# Patient Record
Sex: Female | Born: 1980
Health system: Southern US, Community
[De-identification: ages and names within clinical notes are randomized; demographics above are authoritative.]

## PROBLEM LIST (undated history)

## (undated) DIAGNOSIS — M5136 Other intervertebral disc degeneration, lumbar region: Secondary | ICD-10-CM

## (undated) DIAGNOSIS — G43909 Migraine, unspecified, not intractable, without status migrainosus: Secondary | ICD-10-CM

## (undated) DIAGNOSIS — R002 Palpitations: Secondary | ICD-10-CM

## (undated) DIAGNOSIS — A64 Unspecified sexually transmitted disease: Secondary | ICD-10-CM

## (undated) DIAGNOSIS — M5126 Other intervertebral disc displacement, lumbar region: Secondary | ICD-10-CM

## (undated) DIAGNOSIS — N946 Dysmenorrhea, unspecified: Secondary | ICD-10-CM

## (undated) DIAGNOSIS — F419 Anxiety disorder, unspecified: Secondary | ICD-10-CM

## (undated) DIAGNOSIS — N809 Endometriosis, unspecified: Secondary | ICD-10-CM

## (undated) DIAGNOSIS — N8003 Adenomyosis of the uterus: Secondary | ICD-10-CM

## (undated) DIAGNOSIS — D649 Anemia, unspecified: Secondary | ICD-10-CM

## (undated) DIAGNOSIS — E559 Vitamin D deficiency, unspecified: Secondary | ICD-10-CM

## (undated) DIAGNOSIS — N800 Endometriosis of uterus: Secondary | ICD-10-CM

## (undated) DIAGNOSIS — M51369 Other intervertebral disc degeneration, lumbar region without mention of lumbar back pain or lower extremity pain: Secondary | ICD-10-CM

## (undated) DIAGNOSIS — M419 Scoliosis, unspecified: Secondary | ICD-10-CM

## (undated) DIAGNOSIS — R609 Edema, unspecified: Secondary | ICD-10-CM

## (undated) DIAGNOSIS — M549 Dorsalgia, unspecified: Secondary | ICD-10-CM

## (undated) HISTORY — DX: Endometriosis of uterus: N80.0

## (undated) HISTORY — DX: Palpitations: R00.2

## (undated) HISTORY — DX: Other intervertebral disc degeneration, lumbar region without mention of lumbar back pain or lower extremity pain: M51.369

## (undated) HISTORY — DX: Anemia, unspecified: D64.9

## (undated) HISTORY — DX: Unspecified sexually transmitted disease: A64

## (undated) HISTORY — DX: Scoliosis, unspecified: M41.9

## (undated) HISTORY — DX: Other intervertebral disc degeneration, lumbar region: M51.36

## (undated) HISTORY — DX: Vitamin D deficiency, unspecified: E55.9

## (undated) HISTORY — DX: Adenomyosis of the uterus: N80.03

## (undated) HISTORY — DX: Endometriosis, unspecified: N80.9

## (undated) HISTORY — DX: Migraine, unspecified, not intractable, without status migrainosus: G43.909

## (undated) HISTORY — DX: Dysmenorrhea, unspecified: N94.6

## (undated) HISTORY — DX: Edema, unspecified: R60.9

## (undated) HISTORY — DX: Dorsalgia, unspecified: M54.9

## (undated) HISTORY — DX: Anxiety disorder, unspecified: F41.9

## (undated) HISTORY — DX: Other intervertebral disc displacement, lumbar region: M51.26

---

## 2004-08-24 HISTORY — PX: TUBAL LIGATION: SHX77

## 2011-08-25 HISTORY — PX: TOTAL VAGINAL HYSTERECTOMY: SHX2548

## 2014-08-24 HISTORY — PX: LAMINECTOMY AND MICRODISCECTOMY SPINE: SHX1914

## 2014-09-18 ENCOUNTER — Other Ambulatory Visit: Payer: Self-pay | Admitting: Obstetrics and Gynecology

## 2014-09-18 DIAGNOSIS — R1011 Right upper quadrant pain: Secondary | ICD-10-CM

## 2014-09-25 ENCOUNTER — Ambulatory Visit
Admission: RE | Admit: 2014-09-25 | Discharge: 2014-09-25 | Disposition: A | Discharge status: Home / Self Care | Payer: BLUE CROSS/BLUE SHIELD | Source: Ambulatory Visit | Attending: Obstetrics and Gynecology | Admitting: Obstetrics and Gynecology

## 2014-09-25 DIAGNOSIS — R1011 Right upper quadrant pain: Secondary | ICD-10-CM

## 2014-12-04 ENCOUNTER — Ambulatory Visit
Admission: RE | Admit: 2014-12-04 | Discharge: 2014-12-04 | Disposition: A | Discharge status: Home / Self Care | Payer: BLUE CROSS/BLUE SHIELD | Source: Ambulatory Visit | Attending: Obstetrics and Gynecology | Admitting: Obstetrics and Gynecology

## 2014-12-04 ENCOUNTER — Other Ambulatory Visit: Payer: Self-pay | Admitting: Obstetrics and Gynecology

## 2014-12-04 DIAGNOSIS — M5440 Lumbago with sciatica, unspecified side: Secondary | ICD-10-CM

## 2014-12-13 ENCOUNTER — Ambulatory Visit: Payer: Self-pay | Admitting: Neurology

## 2015-05-28 ENCOUNTER — Other Ambulatory Visit (HOSPITAL_COMMUNITY): Payer: Self-pay | Admitting: Obstetrics and Gynecology

## 2015-05-28 DIAGNOSIS — R609 Edema, unspecified: Secondary | ICD-10-CM

## 2015-05-29 ENCOUNTER — Ambulatory Visit (HOSPITAL_COMMUNITY)
Admission: RE | Admit: 2015-05-29 | Discharge: 2015-05-29 | Disposition: A | Discharge status: Home / Self Care | Payer: 59 | Source: Ambulatory Visit | Attending: Obstetrics and Gynecology | Admitting: Obstetrics and Gynecology

## 2015-05-29 DIAGNOSIS — M7989 Other specified soft tissue disorders: Secondary | ICD-10-CM | POA: Insufficient documentation

## 2015-05-29 DIAGNOSIS — R609 Edema, unspecified: Secondary | ICD-10-CM | POA: Diagnosis not present

## 2015-05-29 NOTE — Progress Notes (Addendum)
*  PRELIMINARY RESULTS* Vascular Ultrasound Lower extremity venous duplex has been completed.  Preliminary findings: negative for DVT and baker's cyst.  Called results to [facility].   Landry Mellow, RDMS, RVT  05/29/2015, 2:34 PM

## 2016-02-17 DIAGNOSIS — R5383 Other fatigue: Secondary | ICD-10-CM | POA: Diagnosis not present

## 2016-02-17 DIAGNOSIS — E876 Hypokalemia: Secondary | ICD-10-CM | POA: Diagnosis not present

## 2016-02-20 DIAGNOSIS — R21 Rash and other nonspecific skin eruption: Secondary | ICD-10-CM | POA: Diagnosis not present

## 2016-02-20 DIAGNOSIS — I8390 Asymptomatic varicose veins of unspecified lower extremity: Secondary | ICD-10-CM | POA: Diagnosis not present

## 2016-02-20 DIAGNOSIS — R7303 Prediabetes: Secondary | ICD-10-CM | POA: Diagnosis not present

## 2016-02-21 ENCOUNTER — Other Ambulatory Visit: Payer: Self-pay | Admitting: Obstetrics & Gynecology

## 2016-02-21 ENCOUNTER — Encounter: Payer: Self-pay | Admitting: Obstetrics & Gynecology

## 2016-02-21 ENCOUNTER — Ambulatory Visit (INDEPENDENT_AMBULATORY_CARE_PROVIDER_SITE_OTHER): Payer: 59 | Admitting: Obstetrics & Gynecology

## 2016-02-21 VITALS — BP 118/70 | HR 72 | Resp 16 | Ht 65.75 in | Wt 218.0 lb

## 2016-02-21 DIAGNOSIS — Z803 Family history of malignant neoplasm of breast: Secondary | ICD-10-CM | POA: Diagnosis not present

## 2016-02-21 DIAGNOSIS — Z0000 Encounter for general adult medical examination without abnormal findings: Secondary | ICD-10-CM | POA: Diagnosis not present

## 2016-02-21 DIAGNOSIS — Z1231 Encounter for screening mammogram for malignant neoplasm of breast: Secondary | ICD-10-CM

## 2016-02-21 DIAGNOSIS — Z01419 Encounter for gynecological examination (general) (routine) without abnormal findings: Secondary | ICD-10-CM | POA: Diagnosis not present

## 2016-02-21 MED ORDER — LORCASERIN HCL 10 MG PO TABS: 1.0000 | ORAL_TABLET | Freq: Two times a day (BID) | ORAL | Status: DC

## 2016-02-21 MED ORDER — METFORMIN HCL 500 MG PO TABS: 500.0000 mg | ORAL_TABLET | Freq: Two times a day (BID) | ORAL | Status: DC

## 2016-02-21 MED ORDER — VALACYCLOVIR HCL 1 G PO TABS: 1000.0000 mg | ORAL_TABLET | ORAL | Status: DC | PRN

## 2016-02-21 MED ORDER — SPIRONOLACTONE 50 MG PO TABS: 50.0000 mg | ORAL_TABLET | Freq: Every day | ORAL | Status: DC

## 2016-02-21 MED FILL — CLOBETASOL 0.05% CREAM: 0.05 | 14 days supply | Qty: 30 | Fill #0

## 2016-02-21 NOTE — Progress Notes (Signed)
35 y.o. G16P3003 Married Caucasian Fe here for annual exam/new patient exam.  Pt with hx of hysterectomy due to bleeding.  Pathology showed fibroids.  Cervix was benign.  D/W she does not need any additional Pap smears.  Pt comfortable with plan.  Pt with PGM and Paunt with breast cancer.  BRCA1/BRCA2 testing was done on pt and this was negative.  This was 2013.    PCP:  Dr. Ernie Hew.  Blood work done recently.  Patient's last menstrual period was 03/17/2012.          Sexually active: Yes.    The current method of family planning is status post hysterectomy.    Exercising: Yes.    walking, eliptical & weights Smoker:  no  Health Maintenance: Pap:  2013 neg MMG:  2013 with u/s neg Colonoscopy:  none BMD:   none TDaP:  2014 Shingles: no Pneumonia: no Hep C and HIV: HIV neg 2014 Labs: none Self breast exam: done monthly   reports that she has never smoked. She does not have any smokeless tobacco history on file. She reports that she drinks alcohol. She reports that she does not use illicit drugs.  Past Medical History  Diagnosis Date  . Anemia   . Anxiety   . Abnormal uterine bleeding   . Adenomyosis   . Dysmenorrhea   . Migraines   . STD (sexually transmitted disease)     HSV1  . Scoliosis   . DDD (degenerative disc disease), lumbar   . Ruptured lumbar disc     Past Surgical History  Procedure Laterality Date  . Laminectomy and microdiscectomy spine  2016    L4/L5  . Abdominal hysterectomy    . Tubal ligation      Current Outpatient Prescriptions  Medication Sig Dispense Refill  . Lorcaserin HCl (BELVIQ) 10 MG TABS Take by mouth. 2 daily    . metFORMIN (GLUCOPHAGE) 500 MG tablet Take by mouth 2 (two) times daily with a meal.    . spironolactone (ALDACTONE) 50 MG tablet Take 50 mg by mouth daily.    . valACYclovir (VALTREX) 1000 MG tablet Take 1,000 mg by mouth as needed.     No current facility-administered medications for this visit.    History reviewed. No  pertinent family history.  ROS:  Pertinent items are noted in HPI.  Otherwise, a comprehensive ROS was negative.  Exam:   BP 118/70 mmHg  Pulse 72  Resp 16  Ht 5' 5.75" (1.67 m)  Wt 218 lb (98.884 kg)  BMI 35.46 kg/m2  LMP 03/17/2012 Height: 5' 5.75" (167 cm) Ht Readings from Last 3 Encounters:  02/21/16 5' 5.75" (1.67 m)    General appearance: alert, cooperative and appears stated age Head: Normocephalic, without obvious abnormality, atraumatic Neck: no adenopathy, supple, symmetrical, trachea midline and thyroid normal to inspection and palpation Lungs: clear to auscultation bilaterally Breasts: normal appearance, no masses or tenderness Heart: regular rate and rhythm Abdomen: soft, non-tender; no masses,  no organomegaly Extremities: extremities normal, atraumatic, no cyanosis or edema Skin: Skin color, texture, turgor normal. No rashes or lesions Lymph nodes: Cervical, supraclavicular, and axillary nodes normal. No abnormal inguinal nodes palpated Neurologic: Grossly normal   Pelvic: External genitalia:  no lesions              Urethra:  normal appearing urethra with no masses, tenderness or lesions              Bartholin's and Skene's: normal  Vagina: normal appearing vagina with normal color and discharge, no lesions              Cervix: absent              Pap taken: No. Bimanual Exam:  Uterus:  uterus absent              Adnexa: no mass, fullness, tenderness               Rectovaginal: Confirms               Anus:  normal sphincter tone, no lesions  Chaperone present: no.  Pt and I decided together to not have a chaperone present.  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  Having ablation done in a week.  P:   Mammogram recommended due to family hx Referral to genetics to consider additional testing due to family hx Glucophage 522m bid.  #60/13RF Spirolactone 585mq day #30/13  RF Valtrex 1 gm qday.  #30/13RF Belqviq 1083mail.  #30/1RF.  Pt will recheck 1 month. Return for fasting labs.  Orders placed.   AEX 1 year or follow up prn.  An After Visit Summary was printed and given to the patient.

## 2016-02-21 NOTE — Addendum Note (Signed)
Addended by: Megan Salon on: 02/21/2016 02:33 PM   Modules accepted: Orders, SmartSet

## 2016-02-24 ENCOUNTER — Other Ambulatory Visit (INDEPENDENT_AMBULATORY_CARE_PROVIDER_SITE_OTHER): Payer: 59

## 2016-02-24 DIAGNOSIS — Z0000 Encounter for general adult medical examination without abnormal findings: Secondary | ICD-10-CM | POA: Diagnosis not present

## 2016-02-24 LAB — COMPREHENSIVE METABOLIC PANEL
ALK PHOS: 56 U/L (ref 33–115)
ALT: 10 U/L (ref 6–29)
AST: 12 U/L (ref 10–30)
Albumin: 4.3 g/dL (ref 3.6–5.1)
BILIRUBIN TOTAL: 0.5 mg/dL (ref 0.2–1.2)
BUN: 15 mg/dL (ref 7–25)
CO2: 25 mmol/L (ref 20–31)
Calcium: 9.3 mg/dL (ref 8.6–10.2)
Chloride: 104 mmol/L (ref 98–110)
Creat: 0.86 mg/dL (ref 0.50–1.10)
GLUCOSE: 81 mg/dL (ref 65–99)
Potassium: 4.5 mmol/L (ref 3.5–5.3)
SODIUM: 140 mmol/L (ref 135–146)
Total Protein: 7 g/dL (ref 6.1–8.1)

## 2016-02-24 LAB — CBC
HCT: 44.2 % (ref 35.0–45.0)
HEMOGLOBIN: 14.6 g/dL (ref 11.7–15.5)
MCH: 28.2 pg (ref 27.0–33.0)
MCHC: 33 g/dL (ref 32.0–36.0)
MCV: 85.5 fL (ref 80.0–100.0)
MPV: 11.3 fL (ref 7.5–12.5)
PLATELETS: 216 10*3/uL (ref 140–400)
RBC: 5.17 MIL/uL — AB (ref 3.80–5.10)
RDW: 13.6 % (ref 11.0–15.0)
WBC: 6.3 10*3/uL (ref 3.8–10.8)

## 2016-02-24 LAB — HEMOGLOBIN A1C
Hgb A1c MFr Bld: 5 % (ref <5.7)
MEAN PLASMA GLUCOSE: 97 mg/dL

## 2016-02-24 LAB — LIPID PANEL
CHOL/HDL RATIO: 2.7 ratio (ref <=5.0)
Cholesterol: 166 mg/dL (ref 125–200)
HDL: 61 mg/dL (ref >=46)
LDL Cholesterol: 82 mg/dL (ref <130)
Triglycerides: 113 mg/dL (ref <150)
VLDL: 23 mg/dL (ref <30)

## 2016-02-24 LAB — TSH: TSH: 1.13 m[IU]/L

## 2016-02-24 MED FILL — ACCU-CHEK AVIVA PLUS TEST S: 50 days supply | Qty: 100 | Fill #0

## 2016-02-24 MED FILL — ACCU-CHEK AVIVA PLUS METER: W/DEVICE | 30 days supply | Qty: 1 | Fill #0

## 2016-02-25 ENCOUNTER — Other Ambulatory Visit: Payer: Self-pay | Admitting: Obstetrics and Gynecology

## 2016-02-25 ENCOUNTER — Encounter: Payer: Self-pay | Admitting: Obstetrics and Gynecology

## 2016-02-25 DIAGNOSIS — E559 Vitamin D deficiency, unspecified: Secondary | ICD-10-CM

## 2016-02-25 LAB — VITAMIN D 25 HYDROXY (VIT D DEFICIENCY, FRACTURES): VIT D 25 HYDROXY: 20 ng/mL — AB (ref 30–100)

## 2016-02-25 MED ORDER — VITAMIN D (ERGOCALCIFEROL) 1.25 MG (50000 UNIT) PO CAPS: 50000.0000 [IU] | ORAL_CAPSULE | ORAL | Status: DC

## 2016-02-26 MED FILL — VIT D2 1.25 MG (50,000 UNIT: 1.25 MG | 84 days supply | Qty: 6 | Fill #0

## 2016-02-26 MED FILL — ALPRAZolam 0.5 MG TABS: 0.5 | 4 days supply | Qty: 4 | Fill #0

## 2016-02-28 DIAGNOSIS — I83892 Varicose veins of left lower extremities with other complications: Secondary | ICD-10-CM | POA: Diagnosis not present

## 2016-02-28 DIAGNOSIS — I8312 Varicose veins of left lower extremity with inflammation: Secondary | ICD-10-CM | POA: Diagnosis not present

## 2016-03-02 DIAGNOSIS — I8312 Varicose veins of left lower extremity with inflammation: Secondary | ICD-10-CM | POA: Diagnosis not present

## 2016-03-02 DIAGNOSIS — I83892 Varicose veins of left lower extremities with other complications: Secondary | ICD-10-CM | POA: Diagnosis not present

## 2016-03-11 ENCOUNTER — Ambulatory Visit
Admission: RE | Admit: 2016-03-11 | Discharge: 2016-03-11 | Disposition: A | Discharge status: Home / Self Care | Payer: 59 | Source: Ambulatory Visit | Attending: Obstetrics & Gynecology | Admitting: Obstetrics & Gynecology

## 2016-03-11 ENCOUNTER — Telehealth: Payer: Self-pay | Admitting: Genetic Counselor

## 2016-03-11 ENCOUNTER — Encounter: Payer: Self-pay | Admitting: Genetic Counselor

## 2016-03-11 DIAGNOSIS — Z1231 Encounter for screening mammogram for malignant neoplasm of breast: Secondary | ICD-10-CM

## 2016-03-11 NOTE — Telephone Encounter (Signed)
Lt mess regarding genetics appt on 8/22 at 2pm with [place], mailed new pt letter

## 2016-03-13 ENCOUNTER — Telehealth: Payer: Self-pay | Admitting: Obstetrics & Gynecology

## 2016-03-13 NOTE — Telephone Encounter (Signed)
error 

## 2016-03-17 DIAGNOSIS — I87322 Chronic venous hypertension (idiopathic) with inflammation of left lower extremity: Secondary | ICD-10-CM | POA: Diagnosis not present

## 2016-03-17 DIAGNOSIS — I83812 Varicose veins of left lower extremities with pain: Secondary | ICD-10-CM | POA: Diagnosis not present

## 2016-03-17 DIAGNOSIS — I8312 Varicose veins of left lower extremity with inflammation: Secondary | ICD-10-CM | POA: Diagnosis not present

## 2016-03-25 MED FILL — FLUCONAZOLE 150 MG TABLET: 150 | 3 days supply | Qty: 3 | Fill #0

## 2016-03-26 ENCOUNTER — Ambulatory Visit (INDEPENDENT_AMBULATORY_CARE_PROVIDER_SITE_OTHER): Payer: 59 | Admitting: Certified Nurse Midwife

## 2016-03-26 ENCOUNTER — Encounter: Payer: Self-pay | Admitting: Certified Nurse Midwife

## 2016-03-26 VITALS — BP 100/68 | HR 72 | Resp 16 | Ht 65.75 in | Wt 221.0 lb

## 2016-03-26 DIAGNOSIS — B3731 Acute candidiasis of vulva and vagina: Secondary | ICD-10-CM

## 2016-03-26 DIAGNOSIS — B373 Candidiasis of vulva and vagina: Secondary | ICD-10-CM

## 2016-03-26 DIAGNOSIS — A499 Bacterial infection, unspecified: Secondary | ICD-10-CM | POA: Diagnosis not present

## 2016-03-26 DIAGNOSIS — B9689 Other specified bacterial agents as the cause of diseases classified elsewhere: Secondary | ICD-10-CM

## 2016-03-26 DIAGNOSIS — N760 Acute vaginitis: Secondary | ICD-10-CM

## 2016-03-26 MED ORDER — NYSTATIN-TRIAMCINOLONE 100000-0.1 UNIT/GM-% EX CREA: 1.0000 "application " | TOPICAL_CREAM | Freq: Two times a day (BID) | CUTANEOUS | 0 refills | Status: AC

## 2016-03-26 MED ORDER — FLUCONAZOLE 150 MG PO TABS: ORAL_TABLET | ORAL | 1 refills | Status: DC

## 2016-03-26 MED FILL — NYSTATIN/TRIAMCINOLONE CRM: 100000-0.1 | 7 days supply | Qty: 60 | Fill #0

## 2016-03-26 NOTE — Progress Notes (Signed)
35 y.o. Married Caucasian female [phone] here with complaint of vaginal symptoms of itching, burning, and increase discharge. Describes discharge as white with no odor. Onset of symptoms 3 days ago. Denies new personal products.Feeling irritated in vulva area. She noted redness when she looked at area. Took Diflucan she had 2 days ago, with some relief of itching. External area more uncomfortable. No STD concerns. Urinary symptoms none . Contraception is hysterectomy.   O:Healthy female WDWN Affect: normal, orientation x 3  Exam: Abdomen:soft, non tender Lymph node: no enlargement or tenderness Pelvic exam: External genital: normal female with redness bilateral on vulva with slight scaling and exudate, wet prep taken BUS: negative Vagina: white watery discharge noted. Ph:4.5 slight odor   ,Wet prep taken,  Cervix: absent Uterus: absent Adnexa:normal, non tender, no masses or fullness noted   Wet Prep results:  KOH,Saline  Positive for yeast and BV  A:Normal pelvic exam Yeast vaginitis/vulvitis BV   P:Discussed findings of yeast vulvitis and BV and etiology. Discussed Aveeno or baking soda sitz bath for comfort. Avoid moist clothes or pads for extended period of time. If working out in gym clothes or swim suits for long periods of time change underwear or bottoms of swimsuit if possible. Coconut Oil use for skin protection prior to activity can be used to external skin for protection or dryness. Rx: Mycolog see order Rx Diflucan see order Patient has Tindamax at home for BV and will take as prescribed. Declines Rx.   Rv prn

## 2016-03-26 NOTE — Patient Instructions (Signed)

## 2016-03-27 NOTE — Progress Notes (Signed)
Encounter reviewed Jill Jertson, MD   

## 2016-04-03 DIAGNOSIS — I87322 Chronic venous hypertension (idiopathic) with inflammation of left lower extremity: Secondary | ICD-10-CM | POA: Diagnosis not present

## 2016-04-03 DIAGNOSIS — I8312 Varicose veins of left lower extremity with inflammation: Secondary | ICD-10-CM | POA: Diagnosis not present

## 2016-04-03 DIAGNOSIS — M7981 Nontraumatic hematoma of soft tissue: Secondary | ICD-10-CM | POA: Diagnosis not present

## 2016-04-03 DIAGNOSIS — I83812 Varicose veins of left lower extremities with pain: Secondary | ICD-10-CM | POA: Diagnosis not present

## 2016-04-09 DIAGNOSIS — D485 Neoplasm of uncertain behavior of skin: Secondary | ICD-10-CM | POA: Diagnosis not present

## 2016-04-09 DIAGNOSIS — B354 Tinea corporis: Secondary | ICD-10-CM | POA: Diagnosis not present

## 2016-04-09 DIAGNOSIS — D2261 Melanocytic nevi of right upper limb, including shoulder: Secondary | ICD-10-CM | POA: Diagnosis not present

## 2016-04-13 ENCOUNTER — Encounter: Payer: Self-pay | Admitting: Genetic Counselor

## 2016-04-14 ENCOUNTER — Other Ambulatory Visit: Payer: 59

## 2016-04-14 ENCOUNTER — Ambulatory Visit (HOSPITAL_BASED_OUTPATIENT_CLINIC_OR_DEPARTMENT_OTHER): Payer: 59 | Admitting: Genetic Counselor

## 2016-04-14 ENCOUNTER — Encounter: Payer: Self-pay | Admitting: Genetic Counselor

## 2016-04-14 DIAGNOSIS — Z8042 Family history of malignant neoplasm of prostate: Secondary | ICD-10-CM

## 2016-04-14 DIAGNOSIS — Z803 Family history of malignant neoplasm of breast: Secondary | ICD-10-CM | POA: Diagnosis not present

## 2016-04-14 DIAGNOSIS — Z315 Encounter for genetic counseling: Secondary | ICD-10-CM

## 2016-04-14 DIAGNOSIS — Z800 Family history of malignant neoplasm of digestive organs: Secondary | ICD-10-CM

## 2016-04-14 DIAGNOSIS — Z801 Family history of malignant neoplasm of trachea, bronchus and lung: Secondary | ICD-10-CM

## 2016-04-14 DIAGNOSIS — Z8041 Family history of malignant neoplasm of ovary: Secondary | ICD-10-CM

## 2016-04-14 NOTE — Progress Notes (Signed)
REFERRING PROVIDER: Megan Salon., MD  PRIMARY PROVIDER:  Rachell Cipro, MD  PRIMARY REASON FOR VISIT:  1. Family history of breast cancer in female   2. Family history of ovarian cancer   3. Family history of prostate cancer   4. Family history of lung cancer   5. Family history- stomach cancer      HISTORY OF PRESENT ILLNESS:   Amy Smith, a 35 y.o. female, was seen for a [facility] cancer genetics consultation at the request of Dr. Sabra Heck due to a family history of early-onset breast cancer and family history of ovarian and prostate cancers and other cancers.  Amy Smith presents to clinic today to discuss the possibility of a hereditary predisposition to cancer, genetic testing, and to further clarify her future cancer risks, as well as potential cancer risks for family members.  Amy Smith reports a history of negative BRCA1/2 genetic testing in 2013.  She returns to clinic today to discuss newer, more comprehensive genetic testing options.   Amy Smith is a 35 y.o. female with no personal history of cancer.     HORMONAL RISK FACTORS:  Menarche was at age 71.  First live birth at age 62.  OCP use for approximately 11 years.  Ovaries intact: yes.  Hysterectomy: yes, TAH in 2012 due to adenomyosis and fibroids.  Menopausal status: has had a hysterectomy.  HRT use: approx 6 months. Colonoscopy: no; not examined. Mammogram within the last year: yes. Number of breast biopsies: 0. Up to date with pelvic exams:  yes. Any excessive radiation exposure/other exposures in the past:  Some history of secondhand smoke exposure (her father smoked until she was 41-41 years old)  Past Medical History:  Diagnosis Date  . Adenomyosis   . Anemia   . Anxiety   . DDD (degenerative disc disease), lumbar   . Dysmenorrhea   . Migraines   . Ruptured lumbar disc   . Scoliosis   . STD (sexually transmitted disease)    HSV1  . Vitamin D deficiency     Past Surgical History:  Procedure  Laterality Date  . LAMINECTOMY AND MICRODISCECTOMY SPINE  2016   L4/L5, Dr. Saintclair Halsted  . TOTAL VAGINAL HYSTERECTOMY  2013   tubes and ovaries reviewed  . TUBAL LIGATION  2006    Social History   Social History  . Marital status: Married    Spouse name: N/A  . Number of children: N/A  . Years of education: N/A   Social History Main Topics  . Smoking status: Never Smoker  . Smokeless tobacco: Never Used  . Alcohol use 0.0 - 0.6 oz/week     Comment: 1-2 drinks per wk, "give or take, if that" (03/2016)  . Drug use: No  . Sexual activity: Yes    Partners: Male    Birth control/ protection: Surgical     Comment: hysterectomy   Other Topics Concern  . None   Social History Narrative  . None     FAMILY HISTORY:  We obtained a detailed, 4-generation family history.  Significant diagnoses are listed below: Family History  Problem Relation Age of Onset  . Hypertension Mother   . Stroke Mother   . Other Mother 33    hx of hysterectomy for adenomyosis  . Diabetes Maternal Uncle   . Parkinson's disease Maternal Uncle 6  . Cataracts Maternal Grandmother   . Parkinson's disease Maternal Grandfather     late age of onset  . COPD Maternal Grandfather  former smoker  . Breast cancer Paternal Grandmother     dx early 23s - right breast; now left breast beign biopsied  . Ovarian cancer Paternal Grandmother     dx late 50s-early 60s  . Diabetes Paternal Grandmother   . Prostate cancer Paternal Grandfather     dx 62s  . Alzheimer's disease Paternal Grandfather   . COPD Paternal Grandfather   . Lupus Cousin     maternal 1st cousin  . Stomach cancer Other     maternal great aunt (MGM's mother) dx older age  . Breast cancer Paternal Aunt 61    dx breast cancer at 61, then with contralateral at 55-46; w/ mets to bone at 8y  . Lung cancer Other     paternal great uncle (PGF's brother)    Amy Smith has three children--one son who is 53 and two daughters, ages 54 and 34.  She  has one full brother who is currently 65 and one paternal half-brother who is 67.  Her mother and father are currently 9 and have not had cancer.  Her mother has a history of a hysterectomy at the age of 67 for similar issues to Amy Smith's.  Amy Smith mother has one full brother and one full sister, ages 38 and 63.  Neither of her siblings or their children have ever had cancer.  Amy Smith's maternal grandmother is currently in her late 58s-early 80s.  She has about 3-4 siblings, and Amy Smith reports no known history of cancer for these great aunts/uncles.  This grandmother's mother died of stomach cancer at an older age.  Amy Smith's maternal grandfather died of COPD and parkinson's at the age of 59.  Amy Smith had limited information for her paternal grandfather's family.  Amy Smith father has one full sister and one full brother.  His sister was diagnosed with breast cancer at the age of 20 and with cancer of the opposite breast cancer at the age of 46-46.  This cancer metastasized to her bone and she passed away at 49.  This aunt has one daughter who is currently in her early 67s and is cancer-free.  His brother is currently 45 and has never had cancer.  Amy Smith's paternal grandmother is currently in her late 79s.  She was diagnosed with ovarian cancer in her late 54s-early 60s and with breast cancer in her early 64s.  Amy Smith's grandmother will undergo biopsies of her opposite breast soon.  Amy Smith has no information for her grandmother's family.  Her paternal grandfather was diagnosed with prostate cancer in his 87s and passed away in his 52s.  He four siblings, one of whom (a brother) had a history of lung cancer.    Amy Smith is unaware of any previous family history of genetic testing for hereditary cancer.  Patient's maternal ancestors are of [place] descent, and paternal ancestors are of [name] and [place] descent. There is no reported Ashkenazi Jewish ancestry. There is no  known consanguinity.  GENETIC COUNSELING ASSESSMENT: Amy Smith is a 35 y.o. female with a family history of breast and ovarian cancer which is somewhat suggestive of a hereditary breast/ovarian cancer syndrome and predisposition to cancer. We, therefore, discussed and recommended the following at today's visit.   DISCUSSION: We reviewed the characteristics, features and inheritance patterns of hereditary cancer syndromes, particularly those caused by mutations in the PALB2, CHEK2, and Lynch syndrome genes. We also discussed genetic testing, including the appropriate family members to test, the  process of testing, insurance coverage and turn-around-time for results. We discussed the implications of a negative, positive and/or variant of uncertain significant result. We recommended Amy Smith pursue genetic testing for the 20-gene Breast/Ovarian Cancer Panel with MSH2 Exons 1-7 Inversion Analysis through [facility].  The Breast/Ovarian Cancer Panel offered by [facility] Hope Pigeon, MD) includes sequencing and deletion/duplication analysis for the following 19 genes:  ATM, BARD1, BRCA1, BRCA2, BRIP1, CDH1, CHEK2, FANCC, MLH1, MSH2, MSH6, NBN, PALB2, PMS2, PTEN, RAD51C, RAD51D, TP53, and XRCC2.  This panel also includes deletion/duplication analysis (without sequencing) for one gene, EPCAM.   Based on Amy Smith's personal and family history of cancer, she meets medical criteria for genetic testing. Despite that she meets criteria, she may still have an out of pocket cost. We discussed that if her out of pocket cost for testing is over $100, the laboratory will call and confirm whether she wants to proceed with testing.  If the out of pocket cost of testing is less than $100 she will be billed by the genetic testing laboratory.   Based on the patient's personal and family history, a statistical models (IBIS/Tyrer-Cuzick) and literature data were used to estimate her risk of developing  breast cancer. This estimates her lifetime risk of developing breast cancer to be approximately 23.6%. This estimation does not take into account any genetic testing results.  The patient's lifetime breast cancer risk is a preliminary estimate based on available information using one of several models endorsed by the [place] (ACS). The ACS recommends consideration of breast MRI screening as an adjunct to mammography for patients at high risk (defined as 20% or greater lifetime risk). A more detailed breast cancer risk assessment can be considered, if clinically indicated.   PLAN: After considering the risks, benefits, and limitations, Amy Smith  provided informed consent to pursue genetic testing and the blood sample was sent to [facility] for analysis of the 20-gene Breast/Ovarian Cancer Panel with MSH2 Exons 1-7 Inversion Analysis. Results should be available within approximately 2-3 weeks' time, at which point they will be disclosed by telephone to Amy Smith, as will any additional recommendations warranted by these results. Amy Smith will receive a summary of her genetic counseling visit and a copy of her results once available. This information will also be available in Epic. We encouraged Amy Smith to remain in contact with cancer genetics annually so that we can continuously update the family history and inform her of any changes in cancer genetics and testing that may be of benefit for her family. Amy Smith questions were answered to her satisfaction today. Our contact information was provided should additional questions or concerns arise.  Thank you for the referral and allowing [name] to share in the care of your patient.   Amy Luz, MS, [facility] Certified Genetic Counselor [email or web address] Phone: [phone]  The patient was seen for a total of 40 minutes in face-to-face genetic counseling.  This patient was discussed with Drs. Magrinat, Lindi Adie and/or Burr Medico  who agrees with the above.    _______________________________________________________________________ For Office Staff:  Number of people involved in session: 1 Was an Intern/ student involved with case: no

## 2016-04-16 ENCOUNTER — Telehealth: Payer: Self-pay | Admitting: *Deleted

## 2016-04-16 NOTE — Telephone Encounter (Signed)
Patient states Belviq is not working. Questioning if she can switch to Phentermine 37.5. [facility] doesn't carry so prescription can be sent to [facility] on [address] in [place].   Routing to provider for review.

## 2016-04-20 DIAGNOSIS — I83812 Varicose veins of left lower extremities with pain: Secondary | ICD-10-CM | POA: Diagnosis not present

## 2016-04-20 DIAGNOSIS — I8312 Varicose veins of left lower extremity with inflammation: Secondary | ICD-10-CM | POA: Diagnosis not present

## 2016-04-20 DIAGNOSIS — I87322 Chronic venous hypertension (idiopathic) with inflammation of left lower extremity: Secondary | ICD-10-CM | POA: Diagnosis not present

## 2016-04-20 MED ORDER — PHENTERMINE HCL 37.5 MG PO CAPS: 37.5000 mg | ORAL_CAPSULE | ORAL | 1 refills | Status: DC

## 2016-04-20 NOTE — Telephone Encounter (Signed)
Rx has been done and pt notified.  She will have BP check 4-6 weeks.  Advised of side effects, risks and reasons to notify me of concerns.

## 2016-04-21 MED FILL — PHENTERMINE 37.5 MG TABLET: 37.5 | 30 days supply | Qty: 30 | Fill #0

## 2016-05-01 MED FILL — valACYclovir HCL 1 GM TABS: 1 | 8 days supply | Qty: 32 | Fill #0

## 2016-05-04 ENCOUNTER — Telehealth: Payer: Self-pay | Admitting: Genetic Counselor

## 2016-05-07 DIAGNOSIS — I8312 Varicose veins of left lower extremity with inflammation: Secondary | ICD-10-CM | POA: Diagnosis not present

## 2016-05-11 ENCOUNTER — Ambulatory Visit: Payer: Self-pay | Admitting: Genetic Counselor

## 2016-05-11 DIAGNOSIS — Z809 Family history of malignant neoplasm, unspecified: Secondary | ICD-10-CM

## 2016-05-11 DIAGNOSIS — Z1379 Encounter for other screening for genetic and chromosomal anomalies: Secondary | ICD-10-CM

## 2016-05-11 DIAGNOSIS — Z8041 Family history of malignant neoplasm of ovary: Secondary | ICD-10-CM

## 2016-05-11 DIAGNOSIS — Z803 Family history of malignant neoplasm of breast: Secondary | ICD-10-CM

## 2016-05-11 NOTE — Telephone Encounter (Signed)
Discussed with Amy Smith that her genetic test results were negative for mutations within any of 20 genes on the Breast/Ovarian Cancer Panel.  Additionally, no uncertain changes were found.  Discussed that this is most likely a reassuring result for [name].  However, we still have not explained the family history of breast and ovarian cancer, so it would be helpful if additional relatives would have genetic testing.  Recommended her father, other paternal relatives, and even her brothers (if her father is not interested in testing) have genetic testing, as there could always be a mutation in the family that Amy Smith herself just did not inherit.  Advised she continue to follow her doctors' recommendations for future cancer screening, including continuing with annual mammograms.  She is likely still eligible for breast MRIs, so recommended discussing this with her doctor as well.  Amy Smith is welcome to call with any questions she may have.  She would like a copy of her test result emailed to her and I am happy to do that.

## 2016-05-17 ENCOUNTER — Emergency Department (HOSPITAL_BASED_OUTPATIENT_CLINIC_OR_DEPARTMENT_OTHER)
Admission: EM | Admit: 2016-05-17 | Discharge: 2016-05-17 | Disposition: A | Discharge status: Home / Self Care | Payer: 59 | Attending: Emergency Medicine | Admitting: Emergency Medicine

## 2016-05-17 ENCOUNTER — Emergency Department (HOSPITAL_COMMUNITY)
Admit: 2016-05-17 | Discharge: 2016-05-17 | Disposition: A | Discharge status: Home / Self Care | Payer: 59 | Attending: Emergency Medicine | Admitting: Emergency Medicine

## 2016-05-17 ENCOUNTER — Encounter (HOSPITAL_BASED_OUTPATIENT_CLINIC_OR_DEPARTMENT_OTHER): Payer: Self-pay | Admitting: Emergency Medicine

## 2016-05-17 ENCOUNTER — Ambulatory Visit (HOSPITAL_COMMUNITY): Payer: 59

## 2016-05-17 DIAGNOSIS — M545 Low back pain: Secondary | ICD-10-CM | POA: Diagnosis not present

## 2016-05-17 DIAGNOSIS — M792 Neuralgia and neuritis, unspecified: Secondary | ICD-10-CM

## 2016-05-17 DIAGNOSIS — M5116 Intervertebral disc disorders with radiculopathy, lumbar region: Secondary | ICD-10-CM | POA: Insufficient documentation

## 2016-05-17 DIAGNOSIS — M5126 Other intervertebral disc displacement, lumbar region: Secondary | ICD-10-CM | POA: Diagnosis not present

## 2016-05-17 DIAGNOSIS — M79605 Pain in left leg: Secondary | ICD-10-CM

## 2016-05-17 DIAGNOSIS — M5136 Other intervertebral disc degeneration, lumbar region: Secondary | ICD-10-CM

## 2016-05-17 MED ORDER — GABAPENTIN 300 MG PO CAPS
300.0000 mg | ORAL_CAPSULE | Freq: Two times a day (BID) | ORAL | 0 refills | Status: DC
Start: 2016-05-17 — End: 2016-08-27

## 2016-05-17 MED ORDER — METHYLPREDNISOLONE 4 MG PO TBPK: ORAL_TABLET | ORAL | 0 refills | Status: DC

## 2016-05-17 MED ORDER — DEXAMETHASONE SODIUM PHOSPHATE 10 MG/ML IJ SOLN
10.0000 mg | Freq: Once | INTRAMUSCULAR | Status: AC
  Administered 2016-05-17: 10 mg via INTRAMUSCULAR
  Filled 2016-05-17: qty 1

## 2016-05-17 NOTE — ED Notes (Signed)
Paged Saintclair Halsted to [employer]

## 2016-05-17 NOTE — ED Notes (Signed)
Okay per MD for pt to go to [facility] in private vehicle.

## 2016-05-17 NOTE — ED Provider Notes (Signed)
[facility] DEPT Provider Note   CSN: [id number] Arrival date & time: 05/17/16  1027     History   Chief Complaint Chief Complaint  Patient presents with  . Leg Pain    HPI Amy Smith is a 35 y.o. female.  HPI Patient sent over from [place] for left lower extremity pain and numbness concerning for nerve impingement. 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 No urinary or bladder incontinence. No difficulty walking. No perineal numbness. Patient provided with Decadron at [facility] which resulted in improved pain.  No other physical complaints at this time. Past Medical History:  Diagnosis Date  . Adenomyosis   . Anemia   . Anxiety   . DDD (degenerative disc disease), lumbar   . Dysmenorrhea   . Migraines   . Ruptured lumbar disc   . Scoliosis   . STD (sexually transmitted disease)    HSV1  . Vitamin D deficiency     Patient Active Problem List   Diagnosis Date Noted  . Family history of breast cancer in female 04/14/2016  . Family history of ovarian cancer 04/14/2016    Past Surgical History:  Procedure Laterality Date  . LAMINECTOMY AND MICRODISCECTOMY SPINE  2016   L4/L5, Dr. Saintclair Halsted  . TOTAL VAGINAL HYSTERECTOMY  2013   tubes and ovaries reviewed  . TUBAL LIGATION  2006    OB History    Gravida Para Term Preterm AB Living   _0 SAB TAB Ectopic Multiple Live Births                   Home Medications    Prior to Admission medications   Medication Sig Start Date End Date Taking? Authorizing Provider  ACCU-CHEK AVIVA PLUS test strip  02/24/16   Historical Provider, MD  ALPRAZolam Duanne Moron) 0.5 MG tablet as needed.  02/26/16   Historical Provider, MD  Blood Glucose Monitoring Suppl (ACCU-CHEK AVIVA PLUS) w/Device KIT  02/24/16   Historical Provider, MD  clobetasol cream (TEMOVATE) 0.05 %  02/21/16   Historical Provider, MD  fluconazole (DIFLUCAN) 150 MG tablet Take one tablet.  Repeat one in 5  days if symptoms are not completely resolved. 03/26/16   Regina Eck, CNM  gabapentin (NEURONTIN) 300 MG capsule Take 1 capsule (300 mg total) by mouth 2 (two) times daily. 05/17/16   Leonard Schwartz, MD  metFORMIN (GLUCOPHAGE) 500 MG tablet Take 1 tablet (500 mg total) by mouth 2 (two) times daily with a meal. 02/21/16   Megan Salon, MD  methylPREDNISolone (MEDROL DOSEPAK) 4 MG TBPK tablet Take as instructed by the [place]. 05/17/16   Fatima Blank, MD  nystatin-triamcinolone [facility] II) cream Apply 1 application topically 2 (two) times daily. Apply to affected area BID for up to 7 days. 03/26/16   Regina Eck, CNM  phentermine 37.5 MG capsule Take 1 capsule (37.5 mg total) by mouth every morning. 04/20/16   Megan Salon, MD  spironolactone (ALDACTONE) 50 MG tablet Take 1 tablet (50 mg total) by mouth daily. 02/21/16   Megan Salon, MD  valACYclovir (VALTREX) 1000 MG tablet Take 1 tablet (1,000 mg total) by mouth as needed. 02/21/16   Megan Salon, MD  Vitamin D, Ergocalciferol, (DRISDOL) 50000 units CAPS capsule Take 1 capsule (50,000 Units total) by mouth every 14 (fourteen) days. 02/25/16   Nunzio Cobbs, MD    Family  History Family History  Problem Relation Age of Onset  . Hypertension Mother   . Stroke Mother   . Other Mother 48    hx of hysterectomy for adenomyosis  . Diabetes Maternal Uncle   . Parkinson's disease Maternal Uncle 58  . Cataracts Maternal Grandmother   . Parkinson's disease Maternal Grandfather     late age of onset  . COPD Maternal Grandfather     former smoker  . Breast cancer Paternal Grandmother     dx early 95s - right breast; now left breast beign biopsied  . Ovarian cancer Paternal Grandmother     dx late 50s-early 60s  . Diabetes Paternal Grandmother   . Prostate cancer Paternal Grandfather     dx 53s  . Alzheimer's disease Paternal Grandfather   . COPD Paternal Grandfather   . Lupus Cousin     maternal 1st cousin  . Stomach  cancer Other     maternal great aunt (MGM's mother) dx older age  . Breast cancer Paternal Aunt 39    dx breast cancer at 34, then with contralateral at 67-46; w/ mets to bone at 61y  . Lung cancer Other     paternal great uncle (PGF's brother)    Social History Social History  Substance Use Topics  . Smoking status: Never Smoker  . Smokeless tobacco: Never Used  . Alcohol use 0.0 - 0.6 oz/week     Comment: 1-2 drinks per wk, "give or take, if that" (03/2016)     Allergies   Cephalexin and Tramadol   Review of Systems Review of Systems  Constitutional: Negative for chills and fever.  HENT: Negative for ear pain and sore throat.   Eyes: Negative for pain and visual disturbance.  Respiratory: Negative for cough and shortness of breath.   Cardiovascular: Negative for chest pain and palpitations.  Gastrointestinal: Negative for abdominal pain and vomiting.  Genitourinary: Negative for dysuria and hematuria.  Musculoskeletal: Negative for arthralgias and back pain.  Skin: Negative for color change and rash.  Neurological: Positive for numbness. Negative for seizures and syncope.  All other systems reviewed and are negative.    Physical Exam Updated Vital Signs BP 117/77 (BP Location: Right Arm)   Pulse 100   Temp 98.7 F (37.1 C) (Oral)   Resp 16   Ht _0  (1.676 m)   Wt 222 lb (100.7 kg)   LMP 03/17/2012   SpO2 99%   BMI 35.83 kg/m   Physical Exam  Constitutional: She is oriented to person, place, and time. She appears well-developed and well-nourished. No distress.  HENT:  Head: Normocephalic and atraumatic.  Right Ear: External ear normal.  Left Ear: External ear normal.  Nose: Nose normal.  Eyes: Conjunctivae and EOM are normal. No scleral icterus.  Neck: Normal range of motion and phonation normal.  Cardiovascular: Normal rate and regular rhythm.   Pulmonary/Chest: Effort normal. No stridor. No respiratory distress.  Abdominal: She exhibits no  distension.  Musculoskeletal: Normal range of motion. She exhibits no edema.  Neurological: She is alert and oriented to person, place, and time. Coordination and gait normal.  Spine Exam: Inspection/Palpation: No lower back pain Strength: 5/5 throughout LE bilaterally (hip flexion/extension, adduction/abduction; knee flexion/extension; foot dorsiflexion/plantarflexion, inversion/eversion; great toe inversion) Sensation: Decreased sensation in left lower extremity and L4 distribution from knee to foot; otherwise sensation intact Reflexes: 2+ quadriceps and achilles reflexes   Skin: She is not diaphoretic.  Psychiatric: She has a normal mood and affect.  Her behavior is normal.  Vitals reviewed.    ED Treatments / Results  Labs (all labs ordered are listed, but only abnormal results are displayed) Labs Reviewed - No data to display  EKG  EKG Interpretation None       Radiology Mr Lumbar Spine Wo Contrast  Result Date: 05/17/2016 CLINICAL DATA:  Low back pain radiating into the left hip for 1 week. No known injury. History of prior lumbar surgery. EXAM: MRI LUMBAR SPINE WITHOUT CONTRAST TECHNIQUE: Multiplanar, multisequence MR imaging of the lumbar spine was performed. No intravenous contrast was administered. COMPARISON:  MRI lumbar spine 06/06/2005 FINDINGS: Segmentation:  Unremarkable. Alignment:  Trace retrolisthesis L3 on L4 and L4 on L5 is seen. Vertebrae: No fracture. Hemangioma in L3 is noted. Degenerative endplate signal change is present at L4-5 and L5-S1. Conus medullaris: Extends to the T12-L1 level and appears normal. Paraspinal and other soft tissues: Unremarkable. Disc levels: T11-12 is imaged in the sagittal plane only and negative. T12-L1:  Negative. L1-2:  Negative. L2-3:  Negative. L3-4: Annular fissure and minimal disc bulge without central canal or foraminal stenosis. L4-5: Left laminotomy defect is identified. The patient has a new very large central and eccentric to  the left disc protrusion with cephalad extension toward the left. The disc causes moderately severe narrowing of the thecal sac at L4-5 and severe narrowing in the left lateral recess with compression of the descending left L4 root. The foramina are open. L5-S1: Minimal disc bulge endplate spur without central canal or foraminal protrusion. IMPRESSION: New very large central and to the left disc protrusion at L4-5 with cephalad extension causes moderately severe narrowing of the thecal sac at L4-5 and compression of the descending left L4 root. Left laminotomy defect at L4-5 is seen as on the prior examination. Electronically Signed   By: Inge Rise M.D.   On: 05/17/2016 14:51    Procedures Procedures (including critical care time)  Medications Ordered in ED Medications  dexamethasone (DECADRON) injection 10 mg (10 mg Intramuscular Given 05/17/16 1153)     Initial Impression / Assessment and Plan / ED Course  I have reviewed the triage vital signs and the nursing notes.  Pertinent labs & imaging results that were available during my care of the patient were reviewed by me and considered in my medical decision making (see chart for details).  Clinical Course    MRI revealed new large central and left this protrusion at L4-L5 with severe narrowing of the thecal sac and compression of L4 root. Spoke with neurosurgery who recommended steroid dose pack and following up in clinic this week.  Final Clinical Impressions(s) / ED Diagnoses   Final diagnoses:  Left leg pain  Peripheral neuropathic pain (HCC)  Bulging lumbar disc   Disposition: Discharge  Condition: Good  I have discussed the results, Dx and Tx plan with the patient who expressed understanding and agree(s) with the plan. Discharge instructions discussed at great length. The patient was given strict return precautions who verbalized understanding of the instructions. No further questions at time of discharge.    New  Prescriptions   GABAPENTIN (NEURONTIN) 300 MG CAPSULE    Take 1 capsule (300 mg total) by mouth 2 (two) times daily.   METHYLPREDNISOLONE (MEDROL DOSEPAK) 4 MG TBPK TABLET    Take as instructed by the [facility].    Follow Up: Kary Kos, MD [address] [place] [address] [phone]  Schedule an appointment as soon as possible for  a visit  For close follow up to assess for disc herniation and L4 impingement      Fatima Blank, MD 05/17/16 [phone]

## 2016-05-17 NOTE — ED Notes (Signed)
MRI has resulted. Pt to be checked into this ER as a patient and will be seen by Dr. Wyvonnia Dusky. Pt and family updated. Pt taken to hallway bed in pod A.

## 2016-05-17 NOTE — ED Notes (Signed)
PT HAS URINE SAMPLE IN LAB.

## 2016-05-17 NOTE — ED Triage Notes (Signed)
Pt took 800mg  ibuprofen and 1/2 flexeril tablet today at approx 0800.

## 2016-05-17 NOTE — ED Provider Notes (Signed)
[facility] DEPT [facility] Provider Note   CSN: [id number] Arrival date & time: 05/17/16  1027     History   Chief Complaint Chief Complaint  Patient presents with  . Leg Pain    HPI Adyn O'Neal is a 35 y.o. female.  HPI Patient presents with left lower leg numbness and pain which began yesterday and got worse today.  Also has pain in her sciatic area on the left side.  No known injury.  No urinary or fecal incontinence.  Has had history of back problems and surgery on her back because of her ruptured disc in the past.  This feels different than that. Past Medical History:  Diagnosis Date  . Adenomyosis   . Anemia   . Anxiety   . DDD (degenerative disc disease), lumbar   . Dysmenorrhea   . Migraines   . Ruptured lumbar disc   . Scoliosis   . STD (sexually transmitted disease)    HSV1  . Vitamin D deficiency     Patient Active Problem List   Diagnosis Date Noted  . Family history of breast cancer in female 04/14/2016  . Family history of ovarian cancer 04/14/2016    Past Surgical History:  Procedure Laterality Date  . LAMINECTOMY AND MICRODISCECTOMY SPINE  2016   L4/L5, Dr. Saintclair Halsted  . TOTAL VAGINAL HYSTERECTOMY  2013   tubes and ovaries reviewed  . TUBAL LIGATION  2006    OB History    Gravida Para Term Preterm AB Living   3 3 3     3    SAB TAB Ectopic Multiple Live Births                   Home Medications    Prior to Admission medications   Medication Sig Start Date End Date Taking? Authorizing Provider  ACCU-CHEK AVIVA PLUS test strip  02/24/16   Historical Provider, MD  ALPRAZolam Duanne Moron) 0.5 MG tablet as needed.  02/26/16   Historical Provider, MD  Blood Glucose Monitoring Suppl (ACCU-CHEK AVIVA PLUS) w/Device KIT  02/24/16   Historical Provider, MD  clobetasol cream (TEMOVATE) 0.05 %  02/21/16   Historical Provider, MD  fluconazole (DIFLUCAN) 150 MG tablet Take one tablet.  Repeat one in 5 days if symptoms are not completely resolved. 03/26/16   Regina Eck, CNM  metFORMIN (GLUCOPHAGE) 500 MG tablet Take 1 tablet (500 mg total) by mouth 2 (two) times daily with a meal. 02/21/16   Megan Salon, MD  nystatin-triamcinolone [facility] II) cream Apply 1 application topically 2 (two) times daily. Apply to affected area BID for up to 7 days. 03/26/16   Regina Eck, CNM  phentermine 37.5 MG capsule Take 1 capsule (37.5 mg total) by mouth every morning. 04/20/16   Megan Salon, MD  spironolactone (ALDACTONE) 50 MG tablet Take 1 tablet (50 mg total) by mouth daily. 02/21/16   Megan Salon, MD  valACYclovir (VALTREX) 1000 MG tablet Take 1 tablet (1,000 mg total) by mouth as needed. 02/21/16   Megan Salon, MD  Vitamin D, Ergocalciferol, (DRISDOL) 50000 units CAPS capsule Take 1 capsule (50,000 Units total) by mouth every 14 (fourteen) days. 02/25/16   Brook Oletta Lamas, MD    Family History Family History  Problem Relation Age of Onset  . Hypertension Mother   . Stroke Mother   . Other Mother 67    hx of hysterectomy for adenomyosis  . Diabetes Maternal Uncle   .  Parkinson's disease Maternal Uncle 40  . Cataracts Maternal Grandmother   . Parkinson's disease Maternal Grandfather     late age of onset  . COPD Maternal Grandfather     former smoker  . Breast cancer Paternal Grandmother     dx early 10s - right breast; now left breast beign biopsied  . Ovarian cancer Paternal Grandmother     dx late 50s-early 60s  . Diabetes Paternal Grandmother   . Prostate cancer Paternal Grandfather     dx 42s  . Alzheimer's disease Paternal Grandfather   . COPD Paternal Grandfather   . Lupus Cousin     maternal 1st cousin  . Stomach cancer Other     maternal great aunt (MGM's mother) dx older age  . Breast cancer Paternal Aunt 95    dx breast cancer at 67, then with contralateral at 23-46; w/ mets to bone at 10y  . Lung cancer Other     paternal great uncle (PGF's brother)    Social History Social History  Substance Use Topics  . Smoking  status: Never Smoker  . Smokeless tobacco: Never Used  . Alcohol use 0.0 - 0.6 oz/week     Comment: 1-2 drinks per wk, "give or take, if that" (03/2016)     Allergies   Cephalexin and Tramadol   Review of Systems Review of Systems All other systems reviewed and are negative  Physical Exam Updated Vital Signs BP 102/70 (BP Location: Left Arm)   Pulse 100   Temp 98.7 F (37.1 C) (Oral)   Resp 18   Ht 5' 6"  (1.676 m)   Wt 222 lb (100.7 kg)   LMP 03/17/2012   SpO2 100%   BMI 35.83 kg/m   Physical Exam Physical Exam  Nursing note and vitals reviewed. Constitutional: She is oriented to person, place, and time. She appears well-developed and well-nourished. No distress.  HENT:  Head: Normocephalic and atraumatic.  Eyes: Pupils are equal, round, and reactive to light.  Neck: Normal range of motion.  Cardiovascular: Normal rate and intact distal pulses.   Pulmonary/Chest: No respiratory distress.  Abdominal: Normal appearance. She exhibits no distension.  Musculoskeletal: Normal range of motion.  Pulses present and strong in both lower extremities.  She has subjective numbness to the perineal nerve distribution.  She also has tenderness in the left sciatic notch to palpation.   Neurological: She is alert and oriented to person, place, and time. No cranial nerve deficit.  Skin: Skin is warm and dry. No rash noted.  Psychiatric: She has a normal mood and affect. Her behavior is normal.    ED Treatments / Results  Labs (all labs ordered are listed, but only abnormal results are displayed) Labs Reviewed - No data to display  EKG  EKG Interpretation None       Radiology No results found.  Procedures Procedures (including critical care time)  Medications Ordered in ED Medications  dexamethasone (DECADRON) injection 10 mg (not administered)     Initial Impression / Assessment and Plan / ED Course  I have reviewed the triage vital signs and the nursing  notes.  Pertinent labs & imaging results that were available during my care of the patient were reviewed by me and considered in my medical decision making (see chart for details).  Clinical Course  No MRIs available here today.  Patient will be sent to [facility] for MRI of the lumbar area.  After MRIs done results can be discussed with  her with outpatient having to be signed in.  My plans to start her on Neurontin by mouth.  She can follow-up with her doctor as outpatient.    Final Clinical Impressions(s) / ED Diagnoses   Final diagnoses:  Left leg pain    New Prescriptions New Prescriptions   No medications on file     Leonard Schwartz, MD 05/17/16 1218

## 2016-05-17 NOTE — ED Notes (Signed)
Pt leaving now for [facility] -- stable, NAD at this time.

## 2016-05-17 NOTE — ED Triage Notes (Signed)
Pt reports left leg pain since yesterday.  Denies any known injury.  H/o sciatica and states similar pain, but hurts worse in lower leg than in hip.

## 2016-05-17 NOTE — Discharge Instructions (Addendum)
Go to [facility] for MRI of your lumbar spine area now.

## 2016-05-19 DIAGNOSIS — M5126 Other intervertebral disc displacement, lumbar region: Secondary | ICD-10-CM | POA: Diagnosis not present

## 2016-05-22 MED FILL — METHYLPREDNISOLONE 4 MG TAB: 4 | 6 days supply | Qty: 21 | Fill #0

## 2016-05-27 ENCOUNTER — Other Ambulatory Visit (INDEPENDENT_AMBULATORY_CARE_PROVIDER_SITE_OTHER): Payer: 59

## 2016-05-27 ENCOUNTER — Other Ambulatory Visit: Payer: Self-pay | Admitting: Obstetrics and Gynecology

## 2016-05-27 DIAGNOSIS — E559 Vitamin D deficiency, unspecified: Secondary | ICD-10-CM

## 2016-05-27 DIAGNOSIS — M5126 Other intervertebral disc displacement, lumbar region: Secondary | ICD-10-CM

## 2016-05-27 DIAGNOSIS — Z01812 Encounter for preprocedural laboratory examination: Secondary | ICD-10-CM | POA: Diagnosis not present

## 2016-05-27 LAB — BASIC METABOLIC PANEL
BUN: 20 mg/dL (ref 7–25)
CALCIUM: 9.2 mg/dL (ref 8.6–10.2)
CO2: 22 mmol/L (ref 20–31)
CREATININE: 1.04 mg/dL (ref 0.50–1.10)
Chloride: 106 mmol/L (ref 98–110)
GLUCOSE: 71 mg/dL (ref 65–99)
Potassium: 4 mmol/L (ref 3.5–5.3)
SODIUM: 140 mmol/L (ref 135–146)

## 2016-05-27 LAB — CBC
HCT: 44.4 % (ref 35.0–45.0)
Hemoglobin: 14.2 g/dL (ref 11.7–15.5)
MCH: 27.7 pg (ref 27.0–33.0)
MCHC: 32 g/dL (ref 32.0–36.0)
MCV: 86.5 fL (ref 80.0–100.0)
MPV: 10.5 fL (ref 7.5–12.5)
Platelets: 241 10*3/uL (ref 140–400)
RBC: 5.13 MIL/uL — ABNORMAL HIGH (ref 3.80–5.10)
RDW: 13.7 % (ref 11.0–15.0)
WBC: 7.4 10*3/uL (ref 3.8–10.8)

## 2016-06-03 DIAGNOSIS — Z9889 Other specified postprocedural states: Secondary | ICD-10-CM | POA: Diagnosis not present

## 2016-06-03 DIAGNOSIS — M5126 Other intervertebral disc displacement, lumbar region: Secondary | ICD-10-CM | POA: Diagnosis not present

## 2016-06-22 DIAGNOSIS — Z1379 Encounter for other screening for genetic and chromosomal anomalies: Secondary | ICD-10-CM | POA: Insufficient documentation

## 2016-06-22 NOTE — Progress Notes (Signed)
GENETIC TEST RESULT  HPI: Amy Smith was previously seen in the [facility] clinic due to a family history of breast, ovarian, and other cancers and concerns regarding a hereditary predisposition to cancer. Please refer to our prior cancer genetics clinic note from April 14, 2016 for more information regarding Amy Smith's medical, social and family histories, and our assessment and recommendations, at the time. Amy Smith recent genetic test results were disclosed to her, as were recommendations warranted by these results. These results and recommendations are discussed in more detail below.  GENETIC TEST RESULTS: At the time of Amy Smith's visit on 04/14/16, we recommended she pursue genetic testing of the 20-gene Breast/Ovarian Cancer Panel with MSH2 Exons 1-7 Inversion Analysis through [employer]. The Breast/Ovarian Cancer Panel offered by [facility] Hope Pigeon, MD) includes sequencing and deletion/duplication analysis for the following 19 genes:  ATM, BARD1, BRCA1, BRCA2, BRIP1, CDH1, CHEK2, FANCC, MLH1, MSH2, MSH6, NBN, PALB2, PMS2, PTEN, RAD51C, RAD51D, TP53, and XRCC2.  This panel also includes deletion/duplication analysis (without sequencing) for one gene, EPCAM.  Those results are now back, the report date for which is May 01, 2016.  Genetic testing was normal, and did not reveal a deleterious mutation in these genes.  Additionally, no variants of uncertain significance (VUSes) were found.  The test report will be scanned into EPIC and will be located under the Results Review tab in the Pathology>Molecular Pathology section.   We discussed with Amy Smith that since the current genetic testing is not perfect, it is possible there may be a gene mutation in one of these genes that current testing cannot detect, but that chance is small. We also discussed, that it is possible that another gene that has not yet been discovered, or that we have not yet  tested, is responsible for the cancer diagnoses in the family, and it is, therefore, important to remain in touch with cancer genetics in the future so that we can continue to offer Amy Smith the most up-to-date genetic testing.   CANCER SCREENING RECOMMENDATIONS: While we still do not have an explanation for the family history of breast and ovarian cancer, this normal result may be reassuring and indicate that Amy Smith does not likely have an increased risk of cancer due to a mutation in one of these genes.  We, therefore, recommended  Amy Smith continue to follow the cancer screening guidelines provided by her primary healthcare providers.  An IBIS/Tyrer-Cuzick model estimates her lifetime breast cancer risk to be approximately 23.6%.  Thus, she is likely eligible for annual breast MRIs in addition to annual mammograms.  She should discuss this option with her primary provider.  Further testing of other relatives may be helpful to provide further insight into the personal and familial cancer risks.  RECOMMENDATIONS FOR FAMILY MEMBERS: Women in this family might be at some increased risk of developing cancer, over the general population risk, simply due to the family history of cancer. We recommended women in this family have a yearly mammogram beginning at age 85, or 80 years younger than the earliest onset of cancer, an annual clinical breast exam, and perform monthly breast self-exams. Women in this family should also have a gynecological exam as recommended by their primary provider. All family members should have a colonoscopy by age 25.  Based on Amy Smith's family history, we recommended her paternal grandmother, who was diagnosed with breast and ovarian cancers, have genetic counseling and testing.  If her grandmother is not  interested in genetic testing, we would recommend that Amy Smith's father and other paternal relatives, such as paternal uncle/cousin, have genetic counseling and testing.  Amy Smith will let us know if we can be of any assistance in coordinating genetic counseling and/or testing for these family members.  For family members who live in other cities, they can use the website of the [employer] ([email or web address]) to find a Oncologist by zip code.   FOLLOW-UP: Lastly, we discussed with Amy Smith that cancer genetics is a rapidly advancing field and it is possible that new genetic tests will be appropriate for her and/or her family members in the future. We encouraged her to remain in contact with cancer genetics on an annual basis so we can update her personal and family histories and let her know of advances in cancer genetics that may benefit this family.   Our contact number was provided. Amy Smith questions were answered to her satisfaction, and she knows she is welcome to call us at anytime with additional questions or concerns.   Jeanine Luz, MS, [facility] Certified Genetic Counselor [email or web address] Phone: [phone]

## 2016-06-25 DIAGNOSIS — M5136 Other intervertebral disc degeneration, lumbar region: Secondary | ICD-10-CM | POA: Diagnosis not present

## 2016-06-25 DIAGNOSIS — M5126 Other intervertebral disc displacement, lumbar region: Secondary | ICD-10-CM | POA: Diagnosis not present

## 2016-06-30 DIAGNOSIS — M5126 Other intervertebral disc displacement, lumbar region: Secondary | ICD-10-CM | POA: Diagnosis not present

## 2016-07-07 ENCOUNTER — Encounter: Payer: Self-pay | Admitting: Physical Therapy

## 2016-07-07 ENCOUNTER — Ambulatory Visit: Payer: 59 | Attending: Family Medicine | Admitting: Physical Therapy

## 2016-07-07 DIAGNOSIS — M5442 Lumbago with sciatica, left side: Secondary | ICD-10-CM | POA: Insufficient documentation

## 2016-07-07 DIAGNOSIS — M62830 Muscle spasm of back: Secondary | ICD-10-CM | POA: Insufficient documentation

## 2016-07-07 DIAGNOSIS — M6281 Muscle weakness (generalized): Secondary | ICD-10-CM | POA: Diagnosis not present

## 2016-07-07 NOTE — Therapy (Signed)
[place], [place], [address] Phone: [phone]   Fax:  [phone]  Physical Therapy Treatment  Patient Details  Name: Amy Smith MRN: [id number] Date of Birth: 1980/11/02 Referring Provider: Dr Saintclair Halsted   Encounter Date: 07/07/2016      PT End of Session - 07/07/16 1206    Visit Number 1   Number of Visits 16   Date for PT Re-Evaluation 09/01/16   PT Start Time 0840   PT Stop Time 0924   PT Time Calculation (min) 44 min   Activity Tolerance Patient tolerated treatment well   Behavior During Therapy [facility] for tasks assessed/performed      Past Medical History:  Diagnosis Date  . Adenomyosis   . Anemia   . Anxiety   . DDD (degenerative disc disease), lumbar   . Dysmenorrhea   . Migraines   . Ruptured lumbar disc   . Scoliosis   . STD (sexually transmitted disease)    HSV1  . Vitamin D deficiency     Past Surgical History:  Procedure Laterality Date  . LAMINECTOMY AND MICRODISCECTOMY SPINE  2016   L4/L5, Dr. Saintclair Halsted  . TOTAL VAGINAL HYSTERECTOMY  2013   tubes and ovaries reviewed  . TUBAL LIGATION  2006    There were no vitals filed for this visit.      Subjective Assessment - 07/07/16 0846    Subjective Patient has a history of lower back disc bulding. Last year she had a lumbar lamenectomy. About a month ago she had an acute onset of left side leg weakness and left leg pain. She had another lumbar lamenectomy. At this time the pain in her leg has improved but she is still having weakness in her legs. She has increased pain as she stands and walks furter. She does a lot of standing and walking at work.     Limitations Walking;Standing;Lifting   How long can you stand comfortably? < 1/2 hour    How long can you walk comfortably? < 30 minutes    Diagnostic tests Nothing post op    Patient Stated Goals To improve the strength in her left leg in order to perfrom work tasks and to return to  work    Currently in Pain? Yes   Pain Score 3    Pain Location Hip   Pain Orientation Left   Pain Descriptors / Indicators Sharp   Pain Type Acute pain;Surgical pain   Pain Onset 1 to 4 weeks ago   Pain Frequency Intermittent   Aggravating Factors  walking and standing    Pain Relieving Factors rest    Effect of Pain on Daily Activities difficulty walking, still out of work.    Multiple Pain Sites No            [facility] PT Assessment - 07/07/16 0001      Assessment   Medical Diagnosis Lumbar disectomy    Referring Provider Dr Saintclair Halsted    Onset Date/Surgical Date 06/03/16   Hand Dominance Right   Next MD Visit December 19th   Prior Therapy Yes after her last surgery      Precautions   Precautions Back   Precaution Booklet Issued No   Precaution Comments BLT back percuations explained      Restrictions   Weight Bearing Restrictions No     Balance Screen   Has the patient fallen in the past 6 months No     Home Environment  Living Environment Private residence   Type of [place] Two level   Alternate Level Stairs-Number of Steps 18     Prior Function   Level of Independence Independent   Vocation Full time employment   Vocation Requirements CNA at [employer]    Leisure going to the gym      Cognition   Overall Cognitive Status Within Functional Limits for tasks assessed   Attention Focused   Focused Attention Appears intact   Memory Appears intact   Awareness Appears intact   Problem Solving Appears intact     Observation/Other Assessments   Focus on Therapeutic Outcomes (FOTO)  51%                      [facility] Adult PT Treatment/Exercise - 07/07/16 0001      Lumbar Exercises: Stretches   Lower Trunk Rotation Limitations x10 rotation of the legs without hip movement to stretch lateral hip    Piriformis Stretch Limitations 2x30sec with cuing for no lumbar rotation      Lumbar Exercises: Supine   Other Supine Lumbar Exercises  supine hip flexion 2x10; supine hip abduction with yellow band 2x10;      Manual Therapy   Manual therapy comments trigger point release to upper gluteal                PT Education - 07/07/16 0958    Education provided Yes   Education Details reviewed percuations, given HEP. reviewed the improtance of symptom management    Person(s) Educated Patient   Methods Explanation;Demonstration   Comprehension Verbalized understanding;Returned demonstration;Verbal cues required          PT Short Term Goals - 07/07/16 1214      PT SHORT TERM GOAL #1   Title Patient will increase bilateral lower extremity strength to 4/5    Time 4   Period Weeks   Status New     PT SHORT TERM GOAL #2   Title Patient will be independent with HEP for light core strength and lower extrmity strength    Time 4   Period Weeks   Status New     PT SHORT TERM GOAL #3   Title Patient ewill report decreased tenderness to plapation of lateral hip with pain no great then 2/10    Time 4   Period Weeks   Status New     PT SHORT TERM GOAL #4   Title Patient will demsotrate a good quad contraction    Time 4   Period Weeks   Status New           PT Long Term Goals - 07/07/16 1217      PT LONG TERM GOAL #1   Title Patient will stand for 2 hours at work without self report of increased pain    Time 8   Period Weeks   Status New     PT LONG TERM GOAL #2   Title Patient is will ambualte for 2 miles without increased pain in the lateral hip    Time 8   Period Weeks   Status New     PT LONG TERM GOAL #3   Title Patient will return to the gym with proparm to protect her back when cleared by the MD               Plan - 07/07/16 1206    Clinical Impression Statement Patient is a  35 year old female who presents with left lower extremity weakness following a lumbar disectomy on 06/03/2016. Her pain is well controled at this time in her back . Most of her pain is in her lateral hip and  upper glut area.  She presents with weakness bilateral L > R. She has decreased lumbar movement. She is still under her back percautions. She would benefit from skilled therapy to improve left leg strength. Her hip and gluteal pain is likley coming from decreased hip flexion and increased abduction with gait. She wads seen fro a low complexity evaluation.    Rehab Potential Good   PT Frequency 2x / week   PT Duration 8 weeks   PT Treatment/Interventions ADLs/Self Care Home Management;Cryotherapy;Electrical Stimulation;Iontophoresis 4mg /ml Dexamethasone;Ultrasound;Moist Heat;Therapeutic activities;Therapeutic exercise;Balance training;Gait training;Stair training;Functional mobility training;Patient/family education;Passive range of motion;Manual techniques;Dry needling;Visual/perceptual remediation/compensation;Neuromuscular re-education;Taping   PT Next Visit Plan continue with manual therapy for left lateral hip and upper glut. Continue with light stretching for the hip; review HEP; add side lying clamshell. Add 3 way hip stregthening in standing for her to do in the pool. Consider half bridge ; Balls squeeze with abdominal bracing     PT Home Exercise Plan supine march; supine hip abduction; piriformis stretch, lateral trunk rotation in low range for outer hip    Consulted and Agree with Plan of Care Patient      Patient will benefit from skilled therapeutic intervention in order to improve the following deficits and impairments:  Decreased strength, Decreased mobility, Decreased activity tolerance, Decreased endurance, Difficulty walking, Increased muscle spasms  Visit Diagnosis: Acute left-sided low back pain with left-sided sciatica  Muscle spasm of back  Muscle weakness (generalized)     Problem List Patient Active Problem List   Diagnosis Date Noted  . Genetic testing 06/22/2016  . Family history of breast cancer in female 04/14/2016  . Family history of ovarian cancer 04/14/2016     Carney Living PT DPT  07/07/2016, 4:15 PM  [facility] [address] [place], [place], [address] Phone: [phone]   Fax:  [phone]  Name: Amy Smith MRN: [id number] Date of Birth: 05-09-1981

## 2016-07-09 ENCOUNTER — Ambulatory Visit: Payer: 59 | Admitting: Physical Therapy

## 2016-07-09 DIAGNOSIS — M6281 Muscle weakness (generalized): Secondary | ICD-10-CM

## 2016-07-09 DIAGNOSIS — M5442 Lumbago with sciatica, left side: Secondary | ICD-10-CM | POA: Diagnosis not present

## 2016-07-09 DIAGNOSIS — M62830 Muscle spasm of back: Secondary | ICD-10-CM

## 2016-07-09 NOTE — Therapy (Signed)
[place], [place], [address] Phone: [phone]   Fax:  [phone]  Physical Therapy Treatment  Patient Details  Name: Amy Smith MRN: [id number] Date of Birth: 03/16/1981 Referring Provider: Dr Saintclair Halsted   Encounter Date: 07/09/2016      PT End of Session - 07/09/16 1012    Visit Number 2   Number of Visits 16   Date for PT Re-Evaluation 09/01/16   PT Start Time 0932   PT Stop Time 1028   PT Time Calculation (min) 56 min   Activity Tolerance Patient tolerated treatment well   Behavior During Therapy [facility] for tasks assessed/performed      Past Medical History:  Diagnosis Date  . Adenomyosis   . Anemia   . Anxiety   . DDD (degenerative disc disease), lumbar   . Dysmenorrhea   . Migraines   . Ruptured lumbar disc   . Scoliosis   . STD (sexually transmitted disease)    HSV1  . Vitamin D deficiency     Past Surgical History:  Procedure Laterality Date  . LAMINECTOMY AND MICRODISCECTOMY SPINE  2016   L4/L5, Dr. Saintclair Halsted  . TOTAL VAGINAL HYSTERECTOMY  2013   tubes and ovaries reviewed  . TUBAL LIGATION  2006    There were no vitals filed for this visit.      Subjective Assessment - 07/09/16 0935    Subjective Pt returns to clinic and massage helped but it was short term.  I would like to try dry needling. I  am trying to walk 30 minutes at least a day.   Limitations Walking;Standing;Lifting   How long can you stand comfortably? < 1/2 hour    How long can you walk comfortably? < 30 minutes    Diagnostic tests Nothing post op    Patient Stated Goals To improve the strength in her left leg in order to perfrom work tasks and to return to work    Currently in Pain? Yes   Pain Score 2    Pain Location Hip   Pain Orientation Left   Pain Descriptors / Indicators Sharp   Pain Type Acute pain;Surgical pain   Pain Onset 1 to 4 weeks ago   Pain Frequency Intermittent                          [facility] Adult PT Treatment/Exercise - 07/09/16 0938      Lumbar Exercises: Stretches   Lower Trunk Rotation Limitations x10 rotation of the legs without hip movement to stretch lateral hip    Piriformis Stretch 30 seconds;5 reps   Piriformis Stretch Limitations cuing for no lumbar rotation  also modified with sitting stretch  with modification of left knee over right knee for morestret     Lumbar Exercises: Supine   Other Supine Lumbar Exercises supine hip flexion 2x10; supine hip abduction with yellow band 2x10;      Moist Heat Therapy   Number Minutes Moist Heat 15 Minutes   Moist Heat Location Lumbar Spine;Hip  left     Manual Therapy   Manual Therapy Soft tissue mobilization;Myofascial release   Manual therapy comments --   Soft tissue mobilization soft tissue of gluts and piriformis trigger points on left side   Myofascial Release left quadratus lumborum in right sidelying          Trigger Point Dry Needling - 07/09/16 0940    Consent Given? Yes  Education Handout Provided Yes   Muscles Treated Lower Body Gluteus maximus;Piriformis;Gluteus minimus  left side only, Quadratus Lumborum twitch   Gluteus Maximus Response Twitch response elicited;Palpable increased muscle length  medius as well all TDN on left side   Gluteus Minimus Response Twitch response elicited;Palpable increased muscle length   Piriformis Response Twitch response elicited;Palpable increased muscle length              PT Education - 07/09/16 0949    Education provided Yes   Education Details reviewed exericises  and modified piriformis exericise, trigger point dry needling education   Person(s) Educated Patient   Methods Explanation;Demonstration;Handout;Verbal cues   Comprehension Verbalized understanding;Returned demonstration          PT Short Term Goals - 07/07/16 1214      PT SHORT TERM GOAL #1   Title Patient will increase bilateral lower  extremity strength to 4/5    Time 4   Period Weeks   Status New     PT SHORT TERM GOAL #2   Title Patient will be independent with HEP for light core strength and lower extrmity strength    Time 4   Period Weeks   Status New     PT SHORT TERM GOAL #3   Title Patient ewill report decreased tenderness to plapation of lateral hip with pain no great then 2/10    Time 4   Period Weeks   Status New     PT SHORT TERM GOAL #4   Title Patient will demsotrate a good quad contraction    Time 4   Period Weeks   Status New           PT Long Term Goals - 07/07/16 1217      PT LONG TERM GOAL #1   Title Patient will stand for 2 hours at work without self report of increased pain    Time 8   Period Weeks   Status New     PT LONG TERM GOAL #2   Title Patient is will ambualte for 2 miles without increased pain in the lateral hip    Time 8   Period Weeks   Status New     PT LONG TERM GOAL #3   Title Patient will return to the gym with proparm to protect her back when cleared by the MD               Plan - 07/09/16 1000    Clinical Impression Statement Pt consented to and requested trigger point dry needling for left back and hip pain today. Pt was closely monitored during entire session.  Pain level 2/10 and was reduced to 0-1//10 as per patient at end of session.  Pt reviewed exericises and piriformis was modified for added stretch   Rehab Potential Good   PT Frequency 2x / week   PT Duration 8 weeks   PT Treatment/Interventions ADLs/Self Care Home Management;Cryotherapy;Electrical Stimulation;Iontophoresis 4mg /ml Dexamethasone;Ultrasound;Moist Heat;Therapeutic activities;Therapeutic exercise;Balance training;Gait training;Stair training;Functional mobility training;Patient/family education;Passive range of motion;Manual techniques;Dry needling;Visual/perceptual remediation/compensation;Neuromuscular re-education;Taping   PT Next Visit Plan  assess dry needling benefit  continue with manual therapy for left lateral hip and upper glut. Continue with light stretching for the hip; review HEP; add side lying clamshell. Add 3 way hip stregthening in standing for her to do in the pool. Consider half bridge ; Balls squeeze with abdominal bracing     PT Home Exercise Plan supine march; supine hip abduction; piriformis stretch, lateral  trunk rotation in low range for outer hip , right sidelying QL stretch    Consulted and Agree with Plan of Care Patient      Patient will benefit from skilled therapeutic intervention in order to improve the following deficits and impairments:  Decreased strength, Decreased mobility, Decreased activity tolerance, Decreased endurance, Difficulty walking, Increased muscle spasms  Visit Diagnosis: Acute left-sided low back pain with left-sided sciatica  Muscle spasm of back  Muscle weakness (generalized)     Problem List Patient Active Problem List   Diagnosis Date Noted  . Genetic testing 06/22/2016  . Family history of breast cancer in female 04/14/2016  . Family history of ovarian cancer 04/14/2016    Voncille Lo, PT Exercise Expert for the Aging Adult  07/09/16 10:13 AM Phone: [phone] Fax: [facility] [address] [place], [place], [address] Phone: [phone]   Fax:  [phone]  Name: Amy Smith MRN: [id number] Date of Birth: [birth year]

## 2016-07-09 NOTE — Patient Instructions (Signed)
Pt given quadratus Lumborum handout and was instructed to lie on right side with upper leg extended for stretch 3-5 minutes.   Trigger Point Dry Needling  . What is Trigger Point Dry Needling (DN)? o DN is a physical therapy technique used to treat muscle pain and dysfunction. Specifically, DN helps deactivate muscle trigger points (muscle knots).  o A thin filiform needle is used to penetrate the skin and stimulate the underlying trigger point. The goal is for a local twitch response (LTR) to occur and for the trigger point to relax. No medication of any kind is injected during the procedure.   . What Does Trigger Point Dry Needling Feel Like?  o The procedure feels different for each individual patient. Some patients report that they do not actually feel the needle enter the skin and overall the process is not painful. Very mild bleeding may occur. However, many patients feel a deep cramping in the muscle in which the needle was inserted. This is the local twitch response.   Marland Kitchen How Will I feel after the treatment? o Soreness is normal, and the onset of soreness may not occur for a few hours. Typically this soreness does not last longer than two days.  o Bruising is uncommon, however; ice can be used to decrease any possible bruising.  o In rare cases feeling tired or nauseous after the treatment is normal. In addition, your symptoms may get worse before they get better, this period will typically not last longer than 24 hours.   . What Can I do After My Treatment? o Increase your hydration by drinking more water for the next 24 hours. o You may place ice or heat on the areas treated that have become sore, however, do not use heat on inflamed or bruised areas. Heat often brings more relief post needling. o You can continue your regular activities, but vigorous activity is not recommended initially after the treatment for 24 hours. o DN is best combined with other physical therapy such as  strengthening, stretching, and other therapies.    Voncille Lo, PT Exercise Expert for the Aging Adult  07/09/16 9:47 AM Phone: [phone] Fax: [phone]

## 2016-07-14 ENCOUNTER — Ambulatory Visit: Payer: 59 | Admitting: Physical Therapy

## 2016-07-14 DIAGNOSIS — M62830 Muscle spasm of back: Secondary | ICD-10-CM

## 2016-07-14 DIAGNOSIS — M5442 Lumbago with sciatica, left side: Secondary | ICD-10-CM | POA: Diagnosis not present

## 2016-07-14 DIAGNOSIS — M6281 Muscle weakness (generalized): Secondary | ICD-10-CM

## 2016-07-14 NOTE — Therapy (Signed)
[place], [place], [address] Phone: [phone]   Fax:  [phone]  Physical Therapy Treatment  Patient Details  Name: Amy Smith MRN: [id number] Date of Birth: Dec 20, 1980 Referring Provider: Dr Saintclair Halsted   Encounter Date: 07/14/2016      PT End of Session - 07/14/16 0851    Visit Number 3   Number of Visits 16   Date for PT Re-Evaluation 09/01/16   PT Start Time 0843   PT Stop Time 0940   PT Time Calculation (min) 57 min   Activity Tolerance Patient tolerated treatment well   Behavior During Therapy [facility] for tasks assessed/performed      Past Medical History:  Diagnosis Date  . Adenomyosis   . Anemia   . Anxiety   . DDD (degenerative disc disease), lumbar   . Dysmenorrhea   . Migraines   . Ruptured lumbar disc   . Scoliosis   . STD (sexually transmitted disease)    HSV1  . Vitamin D deficiency     Past Surgical History:  Procedure Laterality Date  . LAMINECTOMY AND MICRODISCECTOMY SPINE  2016   L4/L5, Dr. Saintclair Halsted  . TOTAL VAGINAL HYSTERECTOMY  2013   tubes and ovaries reviewed  . TUBAL LIGATION  2006    There were no vitals filed for this visit.      Subjective Assessment - 07/14/16 0852    Subjective I was sore and I was very aware of it but I could do normal activites.    Limitations Walking;Standing;Lifting   How long can you stand comfortably? 1 hour   How long can you walk comfortably? 1 hour   Diagnostic tests Nothing post op    Patient Stated Goals To improve the strength in her left leg in order to perfrom work tasks and to return to work    Currently in Pain? Yes   Pain Score 3    Pain Location Hip   Pain Orientation Left   Pain Descriptors / Indicators Sharp  not as intenses   Pain Type Acute pain;Surgical pain   Pain Onset More than a month ago   Pain Frequency Intermittent                         [facility] Adult PT Treatment/Exercise - 07/14/16 0911       Self-Care   Self-Care Other Self-Care Comments   Other Self-Care Comments  use of tennis ball for self myofascial release     Lumbar Exercises: Stretches   Lower Trunk Rotation Limitations --   Piriformis Stretch 30 seconds;5 reps     Lumbar Exercises: Supine   Ab Set 10 reps  with ball squeeze   AB Set Limitations 10 sec      Knee/Hip Exercises: Standing   Other Standing Knee Exercises  3 way SLR standing hip abd, flex and extension x 15 x2     Knee/Hip Exercises: Sidelying   Clams 2 x 15 repetitions.  15 with red t band      Moist Heat Therapy   Number Minutes Moist Heat 15 Minutes   Moist Heat Location Lumbar Spine;Hip  left     Iontophoresis   Type of Iontophoresis Dexamethasone   Location left hip   Dose 1cc patch   Time 4-6 hour patch     Manual Therapy   Manual Therapy Soft tissue mobilization;Myofascial release   Soft tissue mobilization soft tissue of gluts and  piriformis trigger points on left side   Myofascial Release left quadratus lumborum in right sidelying          Trigger Point Dry Needling - 07/14/16 1038    Consent Given? Yes   Gluteus Maximus Response Twitch response elicited;Palpable increased muscle length   Gluteus Minimus Response Twitch response elicited;Palpable increased muscle length   Piriformis Response Twitch response elicited;Palpable increased muscle length              PT Education - 07/14/16 0906    Education provided Yes   Education Details added to the HEP 3 way standing SLR, abdominal bracing with ball and sidelying clam iontophoresis patch/ education   Person(s) Educated Patient   Methods Explanation;Demonstration;Verbal cues;Handout;Tactile cues   Comprehension Verbalized understanding;Returned demonstration          PT Short Term Goals - 07/14/16 0855      PT SHORT TERM GOAL #1   Title Patient will increase bilateral lower extremity strength to 4/5    Time 4   Period Weeks   Status On-going     PT  SHORT TERM GOAL #2   Title Patient will be independent with HEP for light core strength and lower extrmity strength    Time 4   Period Weeks   Status On-going     PT SHORT TERM GOAL #3   Title Patient ewill report decreased tenderness to plapation of lateral hip with pain no great then 2/10    Baseline 3/10 today   Time 4   Period Weeks   Status On-going     PT SHORT TERM GOAL #4   Title Patient will demsotrate a good quad contraction    Time 4   Period Weeks   Status On-going           PT Long Term Goals - 07/07/16 1217      PT LONG TERM GOAL #1   Title Patient will stand for 2 hours at work without self report of increased pain    Time 8   Period Weeks   Status New     PT LONG TERM GOAL #2   Title Patient is will ambualte for 2 miles without increased pain in the lateral hip    Time 8   Period Weeks   Status New     PT LONG TERM GOAL #3   Title Patient will return to the gym with proparm to protect her back when cleared by the MD               Plan - 07/14/16 1033    Clinical Impression Statement Pt returns to work next week . 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  Pt had a markedly tender hip on left and is trying iontophorsis patch today to decrease inflammation to the area.   Rehab Potential Good   PT Frequency 2x / week   PT Duration 8 weeks   PT Treatment/Interventions ADLs/Self Care Home Management;Cryotherapy;Electrical Stimulation;Iontophoresis 4mg /ml Dexamethasone;Ultrasound;Moist Heat;Therapeutic activities;Therapeutic exercise;Balance training;Gait training;Stair training;Functional mobility training;Patient/family education;Passive range of motion;Manual techniques;Dry needling;Visual/perceptual remediation/compensation;Neuromuscular re-education;Taping   PT Next Visit Plan Check goals.  adde SL bridge. bridge. try physioball exericises as able     PT Home Exercise Plan supine march; supine hip abduction; piriformis stretch, lateral trunk rotation in low range for outer hip , right sidelying QL stretch sidelying clamshell,  3 way SLR standing abd bracing with ball   Consulted and Agree with Plan of Care Patient      Patient will benefit from skilled therapeutic intervention in order to improve the following deficits and impairments:  Decreased strength, Decreased mobility, Decreased activity tolerance, Decreased endurance, Difficulty walking, Increased muscle spasms  Visit Diagnosis: Muscle weakness (generalized)  Muscle spasm of back  Acute left-sided low back pain with left-sided sciatica     Problem List Patient Active Problem List   Diagnosis Date Noted  . Genetic testing 06/22/2016  . Family history of breast cancer in female 04/14/2016  . Family history of ovarian cancer 04/14/2016   Voncille Lo, PT Exercise Expert for the Aging Adult  07/14/16 1:37 PM Phone: [phone] Fax: [facility] [address] [place], [place], [address] Phone: [phone]   Fax:  [phone]  Name: Ishara Beaudet MRN: [id number] Date of Birth: 12/20/1980

## 2016-07-14 NOTE — Patient Instructions (Addendum)
  Knee High   Holding stable object, raise knee to hip level, then lower knee. Repeat with other knee. Complete __10_ repetitions x 2. Do __2__ sessions per day.  ABDUCTION: Standing (Active)   Stand, feet flat. Lift right leg out to side. Use _0__ lbs. Complete __10 x 2_ repetitions. Perform __2_ sessions per day.  )        EXTENSION: Standing (Active)  Stand, both feet flat. Draw right leg behind body as far as possible. Use 0___ lbs. Complete 10 repetitions x 2. Perform __2_ sessions per day. IONTOPHORESIS PATIENT PRECAUTIONS & CONTRAINDICATIONS:  . Redness under one or both electrodes can occur.  This characterized by a uniform redness that usually disappears within 12 hours of treatment. . Small pinhead size blisters may result in response to the drug.  Contact your physician if the problem persists more than 24 hours. . On rare occasions, iontophoresis therapy can result in temporary skin reactions such as rash, inflammation, irritation or burns.  The skin reactions may be the result of individual sensitivity to the ionic solution used, the condition of the skin at the start of treatment, reaction to the materials in the electrodes, allergies or sensitivity to dexamethasone, or a poor connection between the patch and your skin.  Discontinue using iontophoresis if you have any of these reactions and report to your therapist. . Remove the Patch or electrodes if you have any undue sensation of pain or burning during the treatment and report discomfort to your therapist. . Tell your Therapist if you have had known adverse reactions to the application of electrical current. . If using the Patch, the LED light will turn off when treatment is complete and the patch can be removed.  Approximate treatment time is 1-3 hours.  Remove the patch when light goes off or after 6 hours. . The Patch can be worn during normal activity, however excessive motion where the electrodes have been  placed can cause poor contact between the skin and the electrode or uneven electrical current resulting in greater risk of skin irritation. Marland Kitchen Keep out of the reach of children.   . DO NOT use if you have a cardiac pacemaker or any other electrically sensitive implanted device. . DO NOT use if you have a known sensitivity to dexamethasone. . DO NOT use during Magnetic Resonance Imaging (MRI). . DO NOT use over broken or compromised skin (e.g. sunburn, cuts, or acne) due to the increased risk of skin reaction. . DO NOT SHAVE over the area to be treated:  To establish good contact between the Patch and the skin, excessive hair may be clipped. . DO NOT place the Patch or electrodes on or over your eyes, directly over your heart, or brain. . DO NOT reuse the Patch or electrodes as this may cause burns to occur.  Copyright  VHI. All rights reserved.   Voncille Lo, PT Exercise Expert for the Aging Adult  07/14/16 9:05 AM Phone: [phone] Fax: [phone]

## 2016-07-24 ENCOUNTER — Ambulatory Visit: Payer: 59 | Attending: Family Medicine | Admitting: Physical Therapy

## 2016-07-24 DIAGNOSIS — M5442 Lumbago with sciatica, left side: Secondary | ICD-10-CM | POA: Insufficient documentation

## 2016-07-24 DIAGNOSIS — M6281 Muscle weakness (generalized): Secondary | ICD-10-CM | POA: Insufficient documentation

## 2016-07-24 DIAGNOSIS — M62830 Muscle spasm of back: Secondary | ICD-10-CM | POA: Insufficient documentation

## 2016-07-24 NOTE — Therapy (Addendum)
[place], [place], [address] Phone: [phone]   Fax:  [phone]  Physical Therapy Treatment  Patient Details  Name: Amy Smith MRN: [id number] Date of Birth: Jun 24, 1981 Referring Provider: Dr Saintclair Halsted   Encounter Date: 07/24/2016      PT End of Session - 07/24/16 1117    Visit Number 4   Number of Visits 16   Date for PT Re-Evaluation 09/01/16   PT Start Time 1055   PT Stop Time 1135   PT Time Calculation (min) 40 min   Activity Tolerance Patient tolerated treatment well   Behavior During Therapy [facility] for tasks assessed/performed      Past Medical History:  Diagnosis Date  . Adenomyosis   . Anemia   . Anxiety   . DDD (degenerative disc disease), lumbar   . Dysmenorrhea   . Migraines   . Ruptured lumbar disc   . Scoliosis   . STD (sexually transmitted disease)    HSV1  . Vitamin D deficiency     Past Surgical History:  Procedure Laterality Date  . LAMINECTOMY AND MICRODISCECTOMY SPINE  2016   L4/L5, Dr. Saintclair Halsted  . TOTAL VAGINAL HYSTERECTOMY  2013   tubes and ovaries reviewed  . TUBAL LIGATION  2006    There were no vitals filed for this visit.      Subjective Assessment - 07/24/16 1100    Subjective Patient reports that the last two days are the first time that her hip really has not hurt her. She has been walking furter. Her pain today is about a 1/10.    Limitations Walking;Standing;Lifting   How long can you stand comfortably? 1 hour   How long can you walk comfortably? 1 hour   Diagnostic tests Nothing post op    Patient Stated Goals To improve the strength in her left leg in order to perfrom work tasks and to return to work    Pain Score 1    Pain Location Hip   Pain Orientation Left   Pain Onset More than a month ago   Aggravating Factors  walking and standing    Pain Relieving Factors rest,    Effect of Pain on Daily Activities difficulty walking    Multiple Pain Sites  No                         [facility] Adult PT Treatment/Exercise - 07/24/16 0001      Lumbar Exercises: Stretches   Lower Trunk Rotation Limitations x10 rotation of the legs without hip movement to stretch lateral hip    Piriformis Stretch 30 seconds;5 reps     Lumbar Exercises: Supine   Other Supine Lumbar Exercises supine hip flexion 2x10; supine hip abduction with yellow band 2x10;    Other Supine Lumbar Exercises ballsqueeze with abdset x10; clamshell with yellow band 2x10; DKTC with ball 2x10      Manual Therapy   Manual Therapy Soft tissue mobilization;Myofascial release   Manual therapy comments trigger point release to upper gluteal                PT Education - 07/24/16 1116    Education provided Yes   Education Details continue with HEP    Person(s) Educated Patient   Methods Explanation;Demonstration;Verbal cues;Tactile cues   Comprehension Verbalized understanding;Returned demonstration          PT Short Term Goals - 07/24/16 1149  PT SHORT TERM GOAL #1   Title Patient will increase bilateral lower extremity strength to 4/5    Time 4   Period Weeks   Status On-going     PT SHORT TERM GOAL #2   Title Patient will be independent with HEP for light core strength and lower extrmity strength    Time 4   Period Weeks   Status On-going     PT SHORT TERM GOAL #3   Title Patient ewill report decreased tenderness to plapation of lateral hip with pain no great then 2/10    Baseline 3/10 today   Time 4   Period Weeks   Status On-going     PT SHORT TERM GOAL #4   Title Patient will demsotrate a good quad contraction    Time 4   Period Weeks   Status On-going           PT Long Term Goals - 07/07/16 1217      PT LONG TERM GOAL #1   Title Patient will stand for 2 hours at work without self report of increased pain    Time 8   Period Weeks   Status New     PT LONG TERM GOAL #2   Title Patient is will ambualte for 2 miles  without increased pain in the lateral hip    Time 8   Period Weeks   Status New     PT LONG TERM GOAL #3   Title Patient will return to the gym with proparm to protect her back when cleared by the MD               Plan - 07/24/16 1146    Clinical Impression Statement Patient is making good progress at this time. She had no pain with new exercises. She was not given new exercises today for her HEP. If she tolerates exercises well she will be given new exercises next visit. If she continues sto have no pain she may D/C to HEP within the next visit or 2.    Rehab Potential Good   PT Frequency 2x / week   PT Duration 8 weeks   PT Treatment/Interventions ADLs/Self Care Home Management;Cryotherapy;Electrical Stimulation;Iontophoresis 78m/ml Dexamethasone;Ultrasound;Moist Heat;Therapeutic activities;Therapeutic exercise;Balance training;Gait training;Stair training;Functional mobility training;Patient/family education;Passive range of motion;Manual techniques;Dry needling;Visual/perceptual remediation/compensation;Neuromuscular re-education;Taping   PT Next Visit Plan Check goals.  adde SL bridge. bridge. try physioball exericises as able    PT Home Exercise Plan supine march; supine hip abduction; piriformis stretch, lateral trunk rotation in low range for outer hip , right sidelying QL stretch sidelying clamshell, 3 way SLR standing abd bracing with ball   Consulted and Agree with Plan of Care Patient      Patient will benefit from skilled therapeutic intervention in order to improve the following deficits and impairments:  Decreased strength, Decreased mobility, Decreased activity tolerance, Decreased endurance, Difficulty walking, Increased muscle spasms  Visit Diagnosis: Muscle weakness (generalized)  Muscle spasm of back  Acute left-sided low back pain with left-sided sciatica    PHYSICAL THERAPY DISCHARGE SUMMARY  Visits from Start of Care: 3  Current functional level  related to goals / functional outcomes: Improvement in pain and function  Remaining deficits: None    Education / Equipment: HEP  Plan: Patient agrees to discharge.  Patient goals were not met. Patient is being discharged due to meeting the stated rehab goals.  ?????      Problem List Patient Active Problem List  Diagnosis Date Noted  . Genetic testing 06/22/2016  . Family history of breast cancer in female 04/14/2016  . Family history of ovarian cancer 04/14/2016    Carney Living PT DPT  07/24/2016, 11:50 AM  [facility] [address] [place], [place], [address] Phone: [phone]   Fax:  [phone]  Name: Amy Smith MRN: [id number] Date of Birth: March 23, 1981

## 2016-07-30 ENCOUNTER — Encounter (INDEPENDENT_AMBULATORY_CARE_PROVIDER_SITE_OTHER): Payer: 59 | Admitting: Family Medicine

## 2016-08-10 ENCOUNTER — Ambulatory Visit (INDEPENDENT_AMBULATORY_CARE_PROVIDER_SITE_OTHER): Payer: 59 | Admitting: Family Medicine

## 2016-08-10 ENCOUNTER — Encounter (INDEPENDENT_AMBULATORY_CARE_PROVIDER_SITE_OTHER): Payer: Self-pay | Admitting: Family Medicine

## 2016-08-10 VITALS — BP 111/74 | HR 102 | Temp 97.5°F | Ht 66.0 in | Wt 225.0 lb

## 2016-08-10 DIAGNOSIS — R5383 Other fatigue: Secondary | ICD-10-CM

## 2016-08-10 DIAGNOSIS — Z1389 Encounter for screening for other disorder: Secondary | ICD-10-CM | POA: Diagnosis not present

## 2016-08-10 DIAGNOSIS — Z9189 Other specified personal risk factors, not elsewhere classified: Secondary | ICD-10-CM

## 2016-08-10 DIAGNOSIS — Z6836 Body mass index (BMI) 36.0-36.9, adult: Secondary | ICD-10-CM | POA: Diagnosis not present

## 2016-08-10 DIAGNOSIS — D500 Iron deficiency anemia secondary to blood loss (chronic): Secondary | ICD-10-CM | POA: Diagnosis not present

## 2016-08-10 DIAGNOSIS — E559 Vitamin D deficiency, unspecified: Secondary | ICD-10-CM

## 2016-08-10 DIAGNOSIS — R0600 Dyspnea, unspecified: Secondary | ICD-10-CM

## 2016-08-10 DIAGNOSIS — E669 Obesity, unspecified: Secondary | ICD-10-CM | POA: Diagnosis not present

## 2016-08-10 DIAGNOSIS — R0609 Other forms of dyspnea: Secondary | ICD-10-CM | POA: Diagnosis not present

## 2016-08-10 DIAGNOSIS — Z1331 Encounter for screening for depression: Secondary | ICD-10-CM

## 2016-08-10 NOTE — Progress Notes (Signed)
Office: [phone]  /  Fax: [phone]   HPI:   Chief Complaint: OBESITY  Amy Smith (MR# [id number]) is a 35 y.o. female who presents on 08/11/2016 for obesity evaluation and treatment. Current BMI is Body mass index is 36.32 kg/m.. Amy Smith has struggled with obesity for years and has been unsuccessful in either losing weight or maintaining long term weight loss. Amy Smith attended our information session and states she is currently in the action stage of change and ready to dedicate time achieving and maintaining a healthier weight.  Amy Smith states she has significant food cravings issues  she snacks frequently in the evenings she has problems with excessive hunger  she frequently eats larger portions than normal  she struggles with emotional eating    Fatigue Amy Smith feels her energy is lower than it should be. This has worsened with weight gain and has not worsened recently. Amy Smith reports daytime somnolence and  reports waking up still tired. Patient is at risk for obstructive sleep apnea. Patent has a history of symptoms of morning fatigue. Patient generally gets 6 or 7 hours of sleep per night, and states they generally have nightime awakenings. Snoring is present. Apneic episodes are not present. Epworth Sleepiness Score is 10  Dyspnea on exertion Amy Smith notes increasing shortness of breath with exercising and seems to be worsening over time with weight gain. She notes getting out of breath sooner with activity than she used to. This has not gotten worse recently. Amy Smith denies shortness of breath at rest or orthopnea.   Vit D deficiency Pt has a hx of vit D deficiency but is not currently on Vit D. No recent labs, + fatigue  Anemia Pt has a hx of anemia but is not on iron supplement, + fatigue  Depression Screen Amy Smith's Food and Mood (modified PHQ-9) score was  Depression screen PHQ 2/9 08/10/2016  Decreased Interest 2  Down, Depressed, Hopeless 1  PHQ - 2 Score 3    Altered sleeping 2  Tired, decreased energy 3  Change in appetite 3  Feeling bad or failure about yourself  1  Trouble concentrating 2  Moving slowly or fidgety/restless 1  Suicidal thoughts 0  PHQ-9 Score 15    ALLERGIES: Allergies  Allergen Reactions  . Cephalexin Hives  . Tramadol Other (See Comments)    Light headed & dizzy    MEDICATIONS: Current Outpatient Prescriptions on File Prior to Visit  Medication Sig Dispense Refill  . ALPRAZolam (XANAX) 0.5 MG tablet as needed.   0  . clobetasol cream (TEMOVATE) 0.05 %   1  . nystatin-triamcinolone (MYCOLOG II) cream Apply 1 application topically 2 (two) times daily. Apply to affected area BID for up to 7 days. 60 g 0  . valACYclovir (VALTREX) 1000 MG tablet Take 1 tablet (1,000 mg total) by mouth as needed. 30 tablet 13  . Vitamin D, Ergocalciferol, (DRISDOL) 50000 units CAPS capsule Take 1 capsule (50,000 Units total) by mouth every 14 (fourteen) days. 6 capsule 0  . ACCU-CHEK AVIVA PLUS test strip   0  . Blood Glucose Monitoring Suppl (ACCU-CHEK AVIVA PLUS) w/Device KIT   0  . fluconazole (DIFLUCAN) 150 MG tablet Take one tablet.  Repeat one in 5 days if symptoms are not completely resolved. (Patient not taking: Reported on 08/10/2016) 2 tablet 1  . gabapentin (NEURONTIN) 300 MG capsule Take 1 capsule (300 mg total) by mouth 2 (two) times daily. (Patient not taking: Reported on 08/10/2016) 60 capsule 0  . metFORMIN (GLUCOPHAGE)  500 MG tablet Take 1 tablet (500 mg total) by mouth 2 (two) times daily with a meal. (Patient not taking: Reported on 08/10/2016) 60 tablet 13  . methylPREDNISolone (MEDROL DOSEPAK) 4 MG TBPK tablet Take as instructed by the Dosepak. (Patient not taking: Reported on 08/10/2016) 21 tablet 0  . phentermine 37.5 MG capsule Take 1 capsule (37.5 mg total) by mouth every morning. (Patient not taking: Reported on 08/10/2016) 30 capsule 1  . spironolactone (ALDACTONE) 50 MG tablet Take 1 tablet (50 mg total) by  mouth daily. (Patient not taking: Reported on 08/10/2016) 30 tablet 13   No current facility-administered medications on file prior to visit.     PAST MEDICAL HISTORY: Past Medical History:  Diagnosis Date  . Adenomyosis   . Anemia   . Anxiety   . Back pain   . DDD (degenerative disc disease), lumbar   . Dysmenorrhea   . Migraines   . Palpitations   . Ruptured lumbar disc   . Scoliosis   . STD (sexually transmitted disease)    HSV1  . Swelling   . Vitamin D deficiency     PAST SURGICAL HISTORY: Past Surgical History:  Procedure Laterality Date  . LAMINECTOMY AND MICRODISCECTOMY SPINE  2016   L4/L5, Dr. Saintclair Halsted  . TOTAL VAGINAL HYSTERECTOMY  2013   tubes and ovaries reviewed  . TUBAL LIGATION  2006    SOCIAL HISTORY: Social History  Substance Use Topics  . Smoking status: Never Smoker  . Smokeless tobacco: Never Used  . Alcohol use 0.0 - 0.6 oz/week     Comment: 1-2 drinks per wk, "give or take, if that" (03/2016)    FAMILY HISTORY: Family History  Problem Relation Age of Onset  . Hypertension Mother   . Stroke Mother   . Other Mother 46    hx of hysterectomy for adenomyosis  . High Cholesterol Mother   . Heart disease Mother   . Obesity Mother   . Diabetes Maternal Uncle   . Parkinson's disease Maternal Uncle 26  . Cataracts Maternal Grandmother   . Parkinson's disease Maternal Grandfather     late age of onset  . COPD Maternal Grandfather     former smoker  . Breast cancer Paternal Grandmother     dx early 51s - right breast; now left breast beign biopsied  . Ovarian cancer Paternal Grandmother     dx late 50s-early 60s  . Diabetes Paternal Grandmother   . Prostate cancer Paternal Grandfather     dx 52s  . Alzheimer's disease Paternal Grandfather   . COPD Paternal Grandfather   . Lupus Cousin     maternal 1st cousin  . Stomach cancer Other     maternal great aunt (MGM's mother) dx older age  . Breast cancer Paternal Aunt 91    dx breast cancer at  16, then with contralateral at 76-46; w/ mets to bone at 56y  . Lung cancer Other     paternal great uncle (PGF's brother)    ROS: Review of Systems  Constitutional: Positive for malaise/fatigue.  HENT: Positive for tinnitus.        Headache  Eyes:       Floaters, Flashes of Light  Cardiovascular:       Cold feet or Hands  Gastrointestinal: Positive for heartburn.  Musculoskeletal: Positive for back pain and joint pain.    PHYSICAL EXAM: Blood pressure 111/74, pulse (!) 102, temperature 97.5 F (36.4 C), temperature source Oral, height 5'  6" (1.676 m), weight 225 lb (102.1 kg), last menstrual period 03/17/2012, SpO2 98 %. Body mass index is 36.32 kg/m. Physical Exam  RECENT LABS AND TESTS: BMET    Component Value Date/Time   NA 140 05/27/2016 1415   K 4.0 05/27/2016 1415   CL 106 05/27/2016 1415   CO2 22 05/27/2016 1415   GLUCOSE 71 05/27/2016 1415   BUN 20 05/27/2016 1415   CREATININE 1.04 05/27/2016 1415   CALCIUM 9.2 05/27/2016 1415   Lab Results  Component Value Date   HGBA1C 5.0 02/24/2016   No results found for: INSULIN CBC    Component Value Date/Time   WBC 7.4 05/27/2016 1415   RBC 5.13 (H) 05/27/2016 1415   HGB 14.2 05/27/2016 1415   HCT 44.4 05/27/2016 1415   PLT 241 05/27/2016 1415   MCV 86.5 05/27/2016 1415   MCH 27.7 05/27/2016 1415   MCHC 32.0 05/27/2016 1415   RDW 13.7 05/27/2016 1415   Iron/TIBC/Ferritin/ %Sat No results found for: IRON, TIBC, FERRITIN, IRONPCTSAT Lipid Panel     Component Value Date/Time   CHOL 166 02/24/2016 0846   TRIG 113 02/24/2016 0846   HDL 61 02/24/2016 0846   CHOLHDL 2.7 02/24/2016 0846   VLDL 23 02/24/2016 0846   LDLCALC 82 02/24/2016 0846   Hepatic Function Panel     Component Value Date/Time   PROT 7.0 02/24/2016 0846   ALBUMIN 4.3 02/24/2016 0846   AST 12 02/24/2016 0846   ALT 10 02/24/2016 0846   ALKPHOS 56 02/24/2016 0846   BILITOT 0.5 02/24/2016 0846      Component Value Date/Time   TSH  1.13 02/24/2016 0846    ECG done today shows NSR @ 100 BPM INDIRECT CALORIMETER done today shows a VO2 of 276 and a REE of 1925.    ASSESSMENT AND PLAN: Other fatigue - Plan: EKG 12-Lead, Comprehensive metabolic panel, Hemoglobin A1c, Insulin, random, Lipid Panel With LDL/HDL Ratio, Vitamin B12, Folate, T3, T4, free, TSH  Dyspnea on exertion  Vitamin D deficiency - Plan: VITAMIN D 25 Hydroxy (Vit-D Deficiency, Fractures)  Iron deficiency anemia due to chronic blood loss - Plan: CBC With Differential, Anemia panel  Depression screen  At risk for heart disease  Class 2 obesity with body mass index (BMI) of 36.0 to 36.9 in adult, unspecified obesity type, unspecified whether serious comorbidity present  PLAN:  Fatigue Tarah was informed that her fatigue may be related to obesity, depression or many other causes. Labs will be ordered, and in the meanwhile Shauntia has agreed to work on diet, exercise and weight loss to help with fatigue. Proper sleep hygiene was discussed including the need for 7-8 hours of quality sleep each night. A sleep study was not ordered based on symptoms and Epworth score.  Exercise Induced Shortness of Breath Eraina's shortness of breath appears to be obesity related and exercise induced. She has agreed to work on weight loss and gradually increase exercise to treat her exercise induced shortness of breath. If Sylvana follows our instructions and loses weight without improvement of her shortness of breath, we will plan to refer to pulmonology. We will monitor this condition regularly. Vinita agrees to this plan.  Vitamin D Deficiency Chole was informed that low vitamin D levels contributes to fatigue and are associated with obesity,  Will check labs and follow  Anemia Will check labs and follow  Depression Screen Milca had a positive depression screening. Depression is commonly associated with obesity and often results in  emotional eating behaviors. We  will monitor this closely and work on CBT to help improve the non-hunger eating patterns. Referral to Psychology may be required if no improvement is seen as she continues in our clinic.  Cardiovascular risk counselling Noele was given extended (at least 15 minutes) coronary artery disease prevention counseling today. She is 35 y.o. female and has risk factors for heart disease including obesity. We discussed intensive lifestyle modifications today with an emphasis on specific weight loss instructions and strategies. Pt was also informed of the importance of increasing exercise and decreasing saturated fats to help prevent heart disease.  Obesity Ambert is currently in the action stage of change and her goal is to continue with weight loss efforts She has agreed to follow the Category 2 plan Tzippy has been instructed to work up to a goal of 150 minutes of combined cardio and strengthening exercise per week for weight loss and overall health benefits. We discussed the following Behavioral Modification Stratagies today: increasing lean protein intake, work on meal planning and easy cooking plans and holiday eating strategies   Nyelle has agreed to follow up with our clinic in 2 weeks. She was informed of the importance of frequent follow up visits to maximize her success with intensive lifestyle modifications for her multiple health conditions. She was informed we would discuss her lab results at her next visit unless there is a critical issue that needs to be addressed sooner. Tracia agreed to keep her next visit at the agreed upon time to discuss these results.

## 2016-08-11 DIAGNOSIS — D500 Iron deficiency anemia secondary to blood loss (chronic): Secondary | ICD-10-CM | POA: Diagnosis not present

## 2016-08-11 DIAGNOSIS — R5383 Other fatigue: Secondary | ICD-10-CM | POA: Diagnosis not present

## 2016-08-11 DIAGNOSIS — E559 Vitamin D deficiency, unspecified: Secondary | ICD-10-CM | POA: Diagnosis not present

## 2016-08-12 LAB — COMPREHENSIVE METABOLIC PANEL
ALT: 16 IU/L (ref 0–32)
AST: 19 IU/L (ref 0–40)
Albumin/Globulin Ratio: 1.3 (ref 1.2–2.2)
Albumin: 3.9 g/dL (ref 3.5–5.5)
Alkaline Phosphatase: 65 IU/L (ref 39–117)
BUN/Creatinine Ratio: 14 (ref 9–23)
BUN: 11 mg/dL (ref 6–20)
Bilirubin Total: 0.3 mg/dL (ref 0.0–1.2)
CALCIUM: 9.1 mg/dL (ref 8.7–10.2)
CO2: 24 mmol/L (ref 18–29)
Chloride: 104 mmol/L (ref 96–106)
Creatinine, Ser: 0.81 mg/dL (ref 0.57–1.00)
GFR, EST AFRICAN AMERICAN: 107 mL/min/{1.73_m2} (ref >59)
GFR, EST NON AFRICAN AMERICAN: 93 mL/min/{1.73_m2} (ref >59)
GLUCOSE: 78 mg/dL (ref 65–99)
Globulin, Total: 2.9 g/dL (ref 1.5–4.5)
POTASSIUM: 4.5 mmol/L (ref 3.5–5.2)
Sodium: 142 mmol/L (ref 134–144)
TOTAL PROTEIN: 6.8 g/dL (ref 6.0–8.5)

## 2016-08-12 LAB — CBC WITH DIFFERENTIAL
BASOS: 0 %
Basophils Absolute: 0 10*3/uL (ref 0.0–0.2)
EOS (ABSOLUTE): 0.1 10*3/uL (ref 0.0–0.4)
EOS: 1 %
Hemoglobin: 13.4 g/dL (ref 11.1–15.9)
IMMATURE GRANULOCYTES: 0 %
Immature Grans (Abs): 0 10*3/uL (ref 0.0–0.1)
LYMPHS ABS: 2.3 10*3/uL (ref 0.7–3.1)
Lymphs: 42 %
MCH: 28.7 pg (ref 26.6–33.0)
MCHC: 33.8 g/dL (ref 31.5–35.7)
MCV: 85 fL (ref 79–97)
MONOS ABS: 0.2 10*3/uL (ref 0.1–0.9)
Monocytes: 4 %
NEUTROS PCT: 53 %
Neutrophils Absolute: 2.9 10*3/uL (ref 1.4–7.0)
RBC: 4.67 x10E6/uL (ref 3.77–5.28)
RDW: 14 % (ref 12.3–15.4)
WBC: 5.5 10*3/uL (ref 3.4–10.8)

## 2016-08-12 LAB — HEMOGLOBIN A1C
Est. average glucose Bld gHb Est-mCnc: 94 mg/dL
Hgb A1c MFr Bld: 4.9 % (ref 4.8–5.6)

## 2016-08-12 LAB — ANEMIA PANEL
FOLATE, HEMOLYSATE: 334.6 ng/mL
Ferritin: 31 ng/mL (ref 15–150)
Folate, RBC: 845 ng/mL (ref >498)
Hematocrit: 39.6 % (ref 34.0–46.6)
IRON: 33 ug/dL (ref 27–159)
Iron Saturation: 11 % — ABNORMAL LOW (ref 15–55)
Retic Ct Pct: 1.2 % (ref 0.6–2.6)
Total Iron Binding Capacity: 298 ug/dL (ref 250–450)
UIBC: 265 ug/dL (ref 131–425)
VITAMIN B 12: 239 pg/mL (ref 232–1245)

## 2016-08-12 LAB — LIPID PANEL WITH LDL/HDL RATIO
Cholesterol, Total: 167 mg/dL (ref 100–199)
HDL: 56 mg/dL (ref >39)
LDL CALC: 95 mg/dL (ref 0–99)
LDL/HDL RATIO: 1.7 ratio (ref 0.0–3.2)
Triglycerides: 78 mg/dL (ref 0–149)
VLDL CHOLESTEROL CAL: 16 mg/dL (ref 5–40)

## 2016-08-12 LAB — T3: T3, Total: 132 ng/dL (ref 71–180)

## 2016-08-12 LAB — TSH: TSH: 1.32 u[IU]/mL (ref 0.450–4.500)

## 2016-08-12 LAB — VITAMIN D 25 HYDROXY (VIT D DEFICIENCY, FRACTURES): VIT D 25 HYDROXY: 16 ng/mL — AB (ref 30.0–100.0)

## 2016-08-12 LAB — INSULIN, RANDOM: INSULIN: 24.5 u[IU]/mL (ref 2.6–24.9)

## 2016-08-12 LAB — T4, FREE: Free T4: 1.2 ng/dL (ref 0.82–1.77)

## 2016-08-12 LAB — FOLATE: Folate: 7.9 ng/mL (ref >3.0)

## 2016-08-18 ENCOUNTER — Encounter (INDEPENDENT_AMBULATORY_CARE_PROVIDER_SITE_OTHER): Payer: Self-pay | Admitting: Family Medicine

## 2016-08-19 NOTE — Telephone Encounter (Signed)
Here is the reply from [place].  I wanted to make sure you saw this.  Thanks

## 2016-08-19 NOTE — Telephone Encounter (Signed)
[place],  Could you please give Amy Smith a call about her appointment.    Thank You

## 2016-08-21 MED FILL — valACYclovir HCL 1 GM TABS: 1 | 8 days supply | Qty: 32 | Fill #1

## 2016-08-25 ENCOUNTER — Ambulatory Visit (INDEPENDENT_AMBULATORY_CARE_PROVIDER_SITE_OTHER): Payer: 59 | Admitting: Family Medicine

## 2016-08-27 ENCOUNTER — Ambulatory Visit (INDEPENDENT_AMBULATORY_CARE_PROVIDER_SITE_OTHER): Payer: 59 | Admitting: Family Medicine

## 2016-08-27 VITALS — BP 107/75 | Ht 66.0 in | Wt 220.0 lb

## 2016-08-27 DIAGNOSIS — E8881 Metabolic syndrome: Secondary | ICD-10-CM | POA: Diagnosis not present

## 2016-08-27 DIAGNOSIS — E559 Vitamin D deficiency, unspecified: Secondary | ICD-10-CM

## 2016-08-27 DIAGNOSIS — Z9189 Other specified personal risk factors, not elsewhere classified: Secondary | ICD-10-CM

## 2016-08-27 MED ORDER — METFORMIN HCL 500 MG PO TABS: 500.0000 mg | ORAL_TABLET | Freq: Every day | ORAL | 0 refills | Status: DC

## 2016-08-27 MED ORDER — VITAMIN D (ERGOCALCIFEROL) 1.25 MG (50000 UNIT) PO CAPS: 50000.0000 [IU] | ORAL_CAPSULE | ORAL | 0 refills | Status: DC

## 2016-08-27 NOTE — Progress Notes (Signed)
Office: [phone]  /  Fax: [phone]   HPI:   Chief Complaint: OBESITY Amy Smith is here to discuss her progress with her obesity treatment plan. She is following her eating plan approximately 85 % of the time and states she is exercising 0 minutes 0 times per week. Amy Smith is currently struggling with occasionally skipping meals. Amy Smith did well with weight loss, however she deviated from plan for lunch. She noticed decreased polyphagia on her category 2 plan.Marland Kitchen  Her weight is 220 lb (99.8 kg) today and 5 lb loss since her last visit. She has lost 5 lbs since starting treatment with [name].  Vitamin D deficiency Amy Smith has a diagnosis of vitamin D deficiency. She is not currently taking vit D, admits to fatigue and denies nausea, vomiting or muscle weakness.  Insulin Resistance Amy Smith has a diagnosis of insulin resistance based on her elevated fasting insulin level >5. Although Amy Smith's blood glucose readings are still under good control, insulin resistance puts her at greater risk of metabolic syndrome and diabetes. She is not taking metformin currently and continues to work on diet and exercise to decrease risk of diabetes. Patient noreports some hypoglycemia.    Wt Readings from Last 500 Encounters:  08/27/16 220 lb (99.8 kg)  08/10/16 225 lb (102.1 kg)  05/17/16 222 lb (100.7 kg)  03/26/16 221 lb (100.2 kg)  02/21/16 218 lb (98.9 kg)     ALLERGIES: Allergies  Allergen Reactions  . Cephalexin Hives  . Tramadol Other (See Comments)    Light headed & dizzy    MEDICATIONS: Current Outpatient Prescriptions on File Prior to Visit  Medication Sig Dispense Refill  . ALPRAZolam (XANAX) 0.5 MG tablet as needed.   0  . clobetasol cream (TEMOVATE) 0.05 %   1  . cyclobenzaprine (FLEXERIL) 10 MG tablet Take 10 mg by mouth 3 (three) times daily as needed for muscle spasms.    Marland Kitchen ibuprofen (ADVIL,MOTRIN) 800 MG tablet Take 800 mg by mouth every 8 (eight) hours as needed.    .  nystatin-triamcinolone (MYCOLOG II) cream Apply 1 application topically 2 (two) times daily. Apply to affected area BID for up to 7 days. 60 g 0  . valACYclovir (VALTREX) 1000 MG tablet Take 1 tablet (1,000 mg total) by mouth as needed. 30 tablet 13   No current facility-administered medications on file prior to visit.     PAST MEDICAL HISTORY: Past Medical History:  Diagnosis Date  . Adenomyosis   . Anemia   . Anxiety   . Back pain   . DDD (degenerative disc disease), lumbar   . Dysmenorrhea   . Migraines   . Palpitations   . Ruptured lumbar disc   . Scoliosis   . STD (sexually transmitted disease)    HSV1  . Swelling   . Vitamin D deficiency     PAST SURGICAL HISTORY: Past Surgical History:  Procedure Laterality Date  . LAMINECTOMY AND MICRODISCECTOMY SPINE  2016   L4/L5, Dr. Saintclair Halsted  . TOTAL VAGINAL HYSTERECTOMY  2013   tubes and ovaries reviewed  . TUBAL LIGATION  2006    SOCIAL HISTORY: Social History  Substance Use Topics  . Smoking status: Never Smoker  . Smokeless tobacco: Never Used  . Alcohol use 0.0 - 0.6 oz/week     Comment: 1-2 drinks per wk, "give or take, if that" (03/2016)    FAMILY HISTORY: Family History  Problem Relation Age of Onset  . Hypertension Mother   . Stroke Mother   .  Other Mother 33    hx of hysterectomy for adenomyosis  . High Cholesterol Mother   . Heart disease Mother   . Obesity Mother   . Diabetes Maternal Uncle   . Parkinson's disease Maternal Uncle 72  . Cataracts Maternal Grandmother   . Parkinson's disease Maternal Grandfather     late age of onset  . COPD Maternal Grandfather     former smoker  . Breast cancer Paternal Grandmother     dx early 43s - right breast; now left breast beign biopsied  . Ovarian cancer Paternal Grandmother     dx late 50s-early 60s  . Diabetes Paternal Grandmother   . Prostate cancer Paternal Grandfather     dx 5s  . Alzheimer's disease Paternal Grandfather   . COPD Paternal Grandfather    . Lupus Cousin     maternal 1st cousin  . Stomach cancer Other     maternal great aunt (MGM's mother) dx older age  . Breast cancer Paternal Aunt 65    dx breast cancer at 21, then with contralateral at 43-46; w/ mets to bone at 44y  . Lung cancer Other     paternal great uncle (PGF's brother)    ROS: Review of Systems  Constitutional: Positive for malaise/fatigue and weight loss.  Gastrointestinal: Negative for nausea and vomiting.  Endo/Heme/Allergies:       Hypoglycemia    PHYSICAL EXAM: Blood pressure 107/75, height 5\' 6"  (1.676 m), weight 220 lb (99.8 kg), last menstrual period 03/17/2012. Body mass index is 35.51 kg/m. Physical Exam  Constitutional: She is oriented to person, place, and time. She appears well-developed and well-nourished.  HENT:  Head: Normocephalic and atraumatic.  Nose: Nose normal.  Eyes: EOM are normal. No scleral icterus.  Neck: Normal range of motion. Neck supple. No thyromegaly present.  Cardiovascular: Normal rate and regular rhythm.   Pulmonary/Chest: No respiratory distress.  Abdominal: Soft. There is no tenderness.  Obesity  Musculoskeletal: Normal range of motion. She exhibits edema.  Neurological: She is alert and oriented to person, place, and time.  Skin: Skin is warm and dry.  Psychiatric: She has a normal mood and affect. Her behavior is normal.    RECENT LABS AND TESTS: BMET    Component Value Date/Time   NA 142 08/11/2016 0950   K 4.5 08/11/2016 0950   CL 104 08/11/2016 0950   CO2 24 08/11/2016 0950   GLUCOSE 78 08/11/2016 0950   GLUCOSE 71 05/27/2016 1415   BUN 11 08/11/2016 0950   CREATININE 0.81 08/11/2016 0950   CREATININE 1.04 05/27/2016 1415   CALCIUM 9.1 08/11/2016 0950   GFRNONAA 93 08/11/2016 0950   GFRAA 107 08/11/2016 0950   Lab Results  Component Value Date   HGBA1C 4.9 08/11/2016   HGBA1C 5.0 02/24/2016   Lab Results  Component Value Date   INSULIN 24.5 08/11/2016   CBC    Component Value  Date/Time   WBC 5.5 08/11/2016 0950   WBC 7.4 05/27/2016 1415   RBC 4.67 08/11/2016 0950   RBC 5.13 (H) 05/27/2016 1415   HGB 14.2 05/27/2016 1415   HCT 39.6 08/11/2016 0950   PLT 241 05/27/2016 1415   MCV 85 08/11/2016 0950   MCH 28.7 08/11/2016 0950   MCH 27.7 05/27/2016 1415   MCHC 33.8 08/11/2016 0950   MCHC 32.0 05/27/2016 1415   RDW 14.0 08/11/2016 0950   LYMPHSABS 2.3 08/11/2016 0950   EOSABS 0.1 08/11/2016 0950   BASOSABS 0.0 08/11/2016 0950  Iron/TIBC/Ferritin/ %Sat    Component Value Date/Time   IRON 33 08/11/2016 0950   TIBC 298 08/11/2016 0950   FERRITIN 31 08/11/2016 0950   IRONPCTSAT 11 (L) 08/11/2016 0950   Lipid Panel     Component Value Date/Time   CHOL 167 08/11/2016 0950   TRIG 78 08/11/2016 0950   HDL 56 08/11/2016 0950   CHOLHDL 2.7 02/24/2016 0846   VLDL 23 02/24/2016 0846   LDLCALC 95 08/11/2016 0950   Hepatic Function Panel     Component Value Date/Time   PROT 6.8 08/11/2016 0950   ALBUMIN 3.9 08/11/2016 0950   AST 19 08/11/2016 0950   ALT 16 08/11/2016 0950   ALKPHOS 65 08/11/2016 0950   BILITOT 0.3 08/11/2016 0950      Component Value Date/Time   TSH 1.320 08/11/2016 0950   TSH 1.13 02/24/2016 0846    ASSESSMENT AND PLAN: Vitamin D deficiency - Plan: Vitamin D, Ergocalciferol, (DRISDOL) 50000 units CAPS capsule  Insulin resistance - Plan: metFORMIN (GLUCOPHAGE) 500 MG tablet  At risk for diabetes mellitus  PLAN:  Vitamin D Deficiency Amy Smith was informed that low vitamin D levels contributes to fatigue and are associated with obesity, breast, and colon cancer. She agrees to re-start taking prescription Vit D @50 ,000 IU every week #4, with no refills and will follow up for routine testing of vitamin D, at least 2-3 times per year. She was informed of the risk of over-replacement of vitamin D and agrees to not increase her dose unless he discusses this with [name] first.  Insulin Resistance Amy Smith will continue to work on weight  loss, exercise, and decreasing simple carbohydrates in her diet to help decrease the risk of diabetes. We dicussed metformin including benefits and risks. She was informed that eating too many simple carbohydrates or too many calories at one sitting increases the likelihood of GI side effects. Summeragreed to re-start metformin 500 MG, 1/2 pill every morning, #15 with no refills and prescription was written today. Amy Smith agreed to follow up with [name] as directed to monitor her progress.  Obesity Amy Smith is currently in the action stage of change. As such, her goal is to continue with weight loss efforts She has agreed to keep a food journal with 350-450 calories and 25 grams protein daily and follow the Category 3 plan Amy Smith has been instructed to work up to a goal of 150 minutes of combined cardio and strengthening exercise per week for weight loss and overall health benefits. We discussed the following Behavioral Modification Stratagies today: increasing lean protein intake, decreasing simple carbohydrates  and increasing lower sugar fruits, no skipping meals.   Tagen has agreed to follow up with our clinic in 2 weeks. She was informed of the importance of frequent follow up visits to maximize her success with intensive lifestyle modifications for her multiple health conditions.  I, Doreene Nest, am acting as scribe for Dennard Nip, MD  I have reviewed the above documentation for accuracy and completeness, and I agree with the above. -Dennard Nip, MD

## 2016-08-28 MED FILL — VIT D2 1.25 MG (50,000 UNIT: 1.25 MG | 28 days supply | Qty: 4 | Fill #0

## 2016-09-07 ENCOUNTER — Encounter (INDEPENDENT_AMBULATORY_CARE_PROVIDER_SITE_OTHER): Payer: Self-pay | Admitting: Family Medicine

## 2016-09-07 NOTE — Telephone Encounter (Signed)
Can you help Amy Smith with rescheduling her appontment?  Thank You

## 2016-09-08 ENCOUNTER — Encounter (INDEPENDENT_AMBULATORY_CARE_PROVIDER_SITE_OTHER): Payer: Self-pay | Admitting: Family Medicine

## 2016-09-08 ENCOUNTER — Ambulatory Visit (INDEPENDENT_AMBULATORY_CARE_PROVIDER_SITE_OTHER): Payer: 59 | Admitting: Family Medicine

## 2016-09-08 VITALS — BP 93/66 | HR 76 | Temp 98.5°F | Ht 66.0 in | Wt 222.0 lb

## 2016-09-08 DIAGNOSIS — F32A Depression, unspecified: Secondary | ICD-10-CM | POA: Insufficient documentation

## 2016-09-08 DIAGNOSIS — R7303 Prediabetes: Secondary | ICD-10-CM

## 2016-09-08 DIAGNOSIS — E669 Obesity, unspecified: Secondary | ICD-10-CM

## 2016-09-08 DIAGNOSIS — F329 Major depressive disorder, single episode, unspecified: Secondary | ICD-10-CM

## 2016-09-08 DIAGNOSIS — Z6835 Body mass index (BMI) 35.0-35.9, adult: Secondary | ICD-10-CM | POA: Diagnosis not present

## 2016-09-08 MED ORDER — BUPROPION HCL ER (SR) 150 MG PO TB12: 150.0000 mg | ORAL_TABLET | Freq: Every day | ORAL | 0 refills | Status: DC

## 2016-09-08 NOTE — Telephone Encounter (Signed)
Were you able to schedule Amy Smith for 5 pm today?

## 2016-09-08 NOTE — Telephone Encounter (Signed)
Please review

## 2016-09-09 MED FILL — BUPROPION SR 150 MG TABLET: 150 | 30 days supply | Qty: 30 | Fill #0

## 2016-09-14 ENCOUNTER — Ambulatory Visit (INDEPENDENT_AMBULATORY_CARE_PROVIDER_SITE_OTHER): Payer: 59 | Admitting: Family Medicine

## 2016-09-14 NOTE — Progress Notes (Signed)
Office: [phone]  /  Fax: [phone]   HPI:   Chief Complaint: OBESITY Amy Smith is here to discuss her progress with her obesity treatment plan. She is following her eating plan approximately 75 % of the time and states she is exercising 0 minutes 0 times per week. Amy Smith is currently struggling with being off track a bit with increased emotional eating, increased work stress and having more options for lunch but often exceeding her calorie limit.  Her weight is 222 lb (100.7 kg) today and has had a weight gain of 2 lbs since her last visit. She has lost 3 lbs since starting treatment with [name].  Depression Depression with emotional eating behaviors Amy Smith is struggling with emotional eating and using food for comfort to the extent that it is negatively impacting her health. She often snacks when she is not hungry. Amy Smith sometimes feels she is out of control and then feels guilty that she made poor food choices. She has been working on behavior modification techniques to help reduce her emotional eating and has been minimally successful. She shows no sign of suicidal or homicidal ideations.  Depression screen PHQ 2/9 08/10/2016  Decreased Interest 2  Down, Depressed, Hopeless 1  PHQ - 2 Score 3  Altered sleeping 2  Tired, decreased energy 3  Change in appetite 3  Feeling bad or failure about yourself  1  Trouble concentrating 2  Moving slowly or fidgety/restless 1  Suicidal thoughts 0  PHQ-9 Score 15   Amy Smith admits to increased emotional eating, especially with sweets, due to increased stress at work, she also reports that she is not sleeping as well.  Pre-Diabetes Amy Smith has a diagnosis of prediabetes based on her elevated HgA1c and was informed this puts her at greater risk of developing diabetes. She re-started metformin, states she had some mild GI upset when she skipped meals. She continues to work on diet and exercise to decrease risk of diabetes. She admits to nausea but  denies hypoglycemia.   Wt Readings from Last 500 Encounters:  09/08/16 222 lb (100.7 kg)  08/27/16 220 lb (99.8 kg)  08/10/16 225 lb (102.1 kg)  05/17/16 222 lb (100.7 kg)  03/26/16 221 lb (100.2 kg)  02/21/16 218 lb (98.9 kg)     ALLERGIES: Allergies  Allergen Reactions  . Cephalexin Hives  . Tramadol Other (See Comments)    Light headed & dizzy    MEDICATIONS: Current Outpatient Prescriptions on File Prior to Visit  Medication Sig Dispense Refill  . ALPRAZolam (XANAX) 0.5 MG tablet as needed.   0  . clobetasol cream (TEMOVATE) 0.05 %   1  . cyclobenzaprine (FLEXERIL) 10 MG tablet Take 10 mg by mouth 3 (three) times daily as needed for muscle spasms.    Marland Kitchen ibuprofen (ADVIL,MOTRIN) 800 MG tablet Take 800 mg by mouth every 8 (eight) hours as needed.    . metFORMIN (GLUCOPHAGE) 500 MG tablet Take 1 tablet (500 mg total) by mouth daily with breakfast. .5 tab every morning 15 tablet 0  . nystatin-triamcinolone (MYCOLOG II) cream Apply 1 application topically 2 (two) times daily. Apply to affected area BID for up to 7 days. 60 g 0  . valACYclovir (VALTREX) 1000 MG tablet Take 1 tablet (1,000 mg total) by mouth as needed. 30 tablet 13  . Vitamin D, Ergocalciferol, (DRISDOL) 50000 units CAPS capsule Take 1 capsule (50,000 Units total) by mouth every 7 (seven) days. 4 capsule 0   No current facility-administered medications on file  prior to visit.     PAST MEDICAL HISTORY: Past Medical History:  Diagnosis Date  . Adenomyosis   . Anemia   . Anxiety   . Back pain   . DDD (degenerative disc disease), lumbar   . Dysmenorrhea   . Migraines   . Palpitations   . Ruptured lumbar disc   . Scoliosis   . STD (sexually transmitted disease)    HSV1  . Swelling   . Vitamin D deficiency     PAST SURGICAL HISTORY: Past Surgical History:  Procedure Laterality Date  . LAMINECTOMY AND MICRODISCECTOMY SPINE  2016   L4/L5, Dr. Saintclair Halsted  . TOTAL VAGINAL HYSTERECTOMY  2013   tubes and  ovaries reviewed  . TUBAL LIGATION  2006    SOCIAL HISTORY: Social History  Substance Use Topics  . Smoking status: Never Smoker  . Smokeless tobacco: Never Used  . Alcohol use 0.0 - 0.6 oz/week     Comment: 1-2 drinks per wk, "give or take, if that" (03/2016)    FAMILY HISTORY: Family History  Problem Relation Age of Onset  . Hypertension Mother   . Stroke Mother   . Other Mother 53    hx of hysterectomy for adenomyosis  . High Cholesterol Mother   . Heart disease Mother   . Obesity Mother   . Diabetes Maternal Uncle   . Parkinson's disease Maternal Uncle 52  . Cataracts Maternal Grandmother   . Parkinson's disease Maternal Grandfather     late age of onset  . COPD Maternal Grandfather     former smoker  . Breast cancer Paternal Grandmother     dx early 30s - right breast; now left breast beign biopsied  . Ovarian cancer Paternal Grandmother     dx late 50s-early 60s  . Diabetes Paternal Grandmother   . Prostate cancer Paternal Grandfather     dx 12s  . Alzheimer's disease Paternal Grandfather   . COPD Paternal Grandfather   . Lupus Cousin     maternal 1st cousin  . Stomach cancer Other     maternal great aunt (MGM's mother) dx older age  . Breast cancer Paternal Aunt 20    dx breast cancer at 47, then with contralateral at 18-46; w/ mets to bone at 89y  . Lung cancer Other     paternal great uncle (PGF's brother)    ROS: Review of Systems  Constitutional: Negative for weight loss.  Gastrointestinal: Positive for diarrhea and nausea.  Endo/Heme/Allergies:       Negative Hypoglycemia  Psychiatric/Behavioral: The patient has insomnia.        Stress    PHYSICAL EXAM: Blood pressure 93/66, pulse 76, temperature 98.5 F (36.9 C), temperature source Oral, height 5\' 6"  (1.676 m), weight 222 lb (100.7 kg), last menstrual period 03/17/2012, SpO2 100 %. Body mass index is 35.83 kg/m. Physical Exam  Constitutional: She is oriented to person, place, and time. She  appears well-developed and well-nourished.  Cardiovascular: Normal rate.   Pulmonary/Chest: Effort normal.  Musculoskeletal: Normal range of motion.  Neurological: She is oriented to person, place, and time.  Skin: Skin is warm and dry.  Psychiatric: She has a normal mood and affect. Her behavior is normal.  Vitals reviewed.   RECENT LABS AND TESTS: BMET    Component Value Date/Time   NA 142 08/11/2016 0950   K 4.5 08/11/2016 0950   CL 104 08/11/2016 0950   CO2 24 08/11/2016 0950   GLUCOSE 78 08/11/2016 0950  GLUCOSE 71 05/27/2016 1415   BUN 11 08/11/2016 0950   CREATININE 0.81 08/11/2016 0950   CREATININE 1.04 05/27/2016 1415   CALCIUM 9.1 08/11/2016 0950   GFRNONAA 93 08/11/2016 0950   GFRAA 107 08/11/2016 0950   Lab Results  Component Value Date   HGBA1C 4.9 08/11/2016   HGBA1C 5.0 02/24/2016   Lab Results  Component Value Date   INSULIN 24.5 08/11/2016   CBC    Component Value Date/Time   WBC 5.5 08/11/2016 0950   WBC 7.4 05/27/2016 1415   RBC 4.67 08/11/2016 0950   RBC 5.13 (H) 05/27/2016 1415   HGB 14.2 05/27/2016 1415   HCT 39.6 08/11/2016 0950   PLT 241 05/27/2016 1415   MCV 85 08/11/2016 0950   MCH 28.7 08/11/2016 0950   MCH 27.7 05/27/2016 1415   MCHC 33.8 08/11/2016 0950   MCHC 32.0 05/27/2016 1415   RDW 14.0 08/11/2016 0950   LYMPHSABS 2.3 08/11/2016 0950   EOSABS 0.1 08/11/2016 0950   BASOSABS 0.0 08/11/2016 0950   Iron/TIBC/Ferritin/ %Sat    Component Value Date/Time   IRON 33 08/11/2016 0950   TIBC 298 08/11/2016 0950   FERRITIN 31 08/11/2016 0950   IRONPCTSAT 11 (L) 08/11/2016 0950   Lipid Panel     Component Value Date/Time   CHOL 167 08/11/2016 0950   TRIG 78 08/11/2016 0950   HDL 56 08/11/2016 0950   CHOLHDL 2.7 02/24/2016 0846   VLDL 23 02/24/2016 0846   LDLCALC 95 08/11/2016 0950   Hepatic Function Panel     Component Value Date/Time   PROT 6.8 08/11/2016 0950   ALBUMIN 3.9 08/11/2016 0950   AST 19 08/11/2016 0950    ALT 16 08/11/2016 0950   ALKPHOS 65 08/11/2016 0950   BILITOT 0.3 08/11/2016 0950      Component Value Date/Time   TSH 1.320 08/11/2016 0950   TSH 1.13 02/24/2016 0846    ASSESSMENT AND PLAN: Depression, unspecified depression type - Plan: buPROPion (WELLBUTRIN SR) 150 MG 12 hr tablet  Prediabetes  Class 2 obesity without serious comorbidity with body mass index (BMI) of 35.0 to 35.9 in adult, unspecified obesity type  PLAN:  Depression Depression with Emotional Eating Behaviors We discussed behavior modification techniques today to help Nancie deal with her emotional eating and depression.  Tequita agrees to start to take Wellbutrin SR 150 MG every morning #30 with no refills and will follow up at next visit.  Pre-Diabetes Kalliope will continue to work on weight loss, exercise, and decreasing simple carbohydrates in her diet to help decrease the risk of diabetes. We dicussed metformin including benefits and risks. She was informed that eating too many simple carbohydrates or too many calories at one sitting increases the likelihood of GI side effects. Brocha agrees to take metformin with food and to follow up with [name] as directed to monitor her progress.  Obesity Fleurette is currently in the action stage of change. As such, her goal is to continue with weight loss efforts She has agreed to keep a food journal with 350-450 calories and 25 grams of protein daily at lunch. Dilara has been instructed to work up to a goal of 150 minutes of combined cardio and strengthening exercise per week for weight loss and overall health benefits. We discussed the following Behavioral Modification Stratagies today: increasing lean protein intake, increasing vegetables, decrease eating out and emotional eating strategies, no skipping meals.  Kryssa has agreed to follow up with our clinic in 2 weeks.  She was informed of the importance of frequent follow up visits to maximize her success with intensive  lifestyle modifications for her multiple health conditions.   I, Doreene Nest, am acting as scribe for Dennard Nip, MD  I have reviewed the above documentation for accuracy and completeness, and I agree with the above. -Dennard Nip, MD

## 2016-09-15 ENCOUNTER — Telehealth (INDEPENDENT_AMBULATORY_CARE_PROVIDER_SITE_OTHER): Payer: Self-pay | Admitting: Family Medicine

## 2016-09-15 NOTE — Telephone Encounter (Signed)
Aaron Edelman with [facility] called wanting direction for medication Metformin.  Phone: [phone] Jan 5th an inbox message was sent as well.

## 2016-09-15 NOTE — Telephone Encounter (Signed)
Spoke with Aaron Edelman and the sig on the medication was clarified.

## 2016-09-21 ENCOUNTER — Ambulatory Visit (INDEPENDENT_AMBULATORY_CARE_PROVIDER_SITE_OTHER): Payer: 59 | Admitting: Family Medicine

## 2016-09-21 VITALS — BP 104/73 | HR 92 | Temp 98.7°F | Ht 66.0 in | Wt 220.0 lb

## 2016-09-21 DIAGNOSIS — E559 Vitamin D deficiency, unspecified: Secondary | ICD-10-CM | POA: Diagnosis not present

## 2016-09-21 DIAGNOSIS — Z6835 Body mass index (BMI) 35.0-35.9, adult: Secondary | ICD-10-CM

## 2016-09-21 DIAGNOSIS — E669 Obesity, unspecified: Secondary | ICD-10-CM | POA: Diagnosis not present

## 2016-09-21 DIAGNOSIS — F3289 Other specified depressive episodes: Secondary | ICD-10-CM | POA: Diagnosis not present

## 2016-09-21 MED ORDER — VITAMIN D (ERGOCALCIFEROL) 1.25 MG (50000 UNIT) PO CAPS: 50000.0000 [IU] | ORAL_CAPSULE | ORAL | 0 refills | Status: DC

## 2016-09-22 MED FILL — VIT D2 1.25 MG (50,000 UNIT: 1.25 MG | 28 days supply | Qty: 4 | Fill #0

## 2016-09-22 NOTE — Progress Notes (Signed)
Office: [phone]  /  Fax: [phone]   HPI:   Chief Complaint: OBESITY Amy Smith is here to discuss her progress with her obesity treatment plan. She is following her eating plan approximately 90 % of the time and states she is exercising 0 minutes 0 times per week. Amy Smith is currently struggling with sometimes skipping meals and some celebration eating. She has increased eating out but is working on meal planning. Her weight is 220 lb (99.8 kg) today and has had a weight loss of 2 pounds over a period of 2 weeks since her last visit. She has lost 5 lbs since starting treatment with [name].  Vitamin D deficiency Amy Smith has a diagnosis of vitamin D deficiency. She started on vitamin D but she is not yet at goal, she still notes fatigue and denies nausea, vomiting or muscle weakness.  Depression Amy Smith is doing well with starting Wellbutrin, denies insomnia, no elevated blood pressure. Her mood is slightly better, her emotional eating has decreased.  Wt Readings from Last 500 Encounters:  09/21/16 220 lb (99.8 kg)  09/08/16 222 lb (100.7 kg)  08/27/16 220 lb (99.8 kg)  08/10/16 225 lb (102.1 kg)  05/17/16 222 lb (100.7 kg)  03/26/16 221 lb (100.2 kg)  02/21/16 218 lb (98.9 kg)     ALLERGIES: Allergies  Allergen Reactions  . Cephalexin Hives  . Tramadol Other (See Comments)    Light headed & dizzy    MEDICATIONS: Current Outpatient Prescriptions on File Prior to Visit  Medication Sig Dispense Refill  . ALPRAZolam (XANAX) 0.5 MG tablet as needed.   0  . buPROPion (WELLBUTRIN SR) 150 MG 12 hr tablet Take 1 tablet (150 mg total) by mouth daily. 30 tablet 0  . clobetasol cream (TEMOVATE) 0.05 %   1  . cyclobenzaprine (FLEXERIL) 10 MG tablet Take 10 mg by mouth 3 (three) times daily as needed for muscle spasms.    . metFORMIN (GLUCOPHAGE) 500 MG tablet Take 1 tablet (500 mg total) by mouth daily with breakfast. .5 tab every morning 15 tablet 0  . nystatin-triamcinolone (MYCOLOG  II) cream Apply 1 application topically 2 (two) times daily. Apply to affected area BID for up to 7 days. 60 g 0  . valACYclovir (VALTREX) 1000 MG tablet Take 1 tablet (1,000 mg total) by mouth as needed. 30 tablet 13   No current facility-administered medications on file prior to visit.     PAST MEDICAL HISTORY: Past Medical History:  Diagnosis Date  . Adenomyosis   . Anemia   . Anxiety   . Back pain   . DDD (degenerative disc disease), lumbar   . Dysmenorrhea   . Migraines   . Palpitations   . Ruptured lumbar disc   . Scoliosis   . STD (sexually transmitted disease)    HSV1  . Swelling   . Vitamin D deficiency     PAST SURGICAL HISTORY: Past Surgical History:  Procedure Laterality Date  . LAMINECTOMY AND MICRODISCECTOMY SPINE  2016   L4/L5, Dr. Saintclair Halsted  . TOTAL VAGINAL HYSTERECTOMY  2013   tubes and ovaries reviewed  . TUBAL LIGATION  2006    SOCIAL HISTORY: Social History  Substance Use Topics  . Smoking status: Never Smoker  . Smokeless tobacco: Never Used  . Alcohol use 0.0 - 0.6 oz/week     Comment: 1-2 drinks per wk, "give or take, if that" (03/2016)    FAMILY HISTORY: Family History  Problem Relation Age of Onset  .  Hypertension Mother   . Stroke Mother   . Other Mother 63    hx of hysterectomy for adenomyosis  . High Cholesterol Mother   . Heart disease Mother   . Obesity Mother   . Diabetes Maternal Uncle   . Parkinson's disease Maternal Uncle 39  . Cataracts Maternal Grandmother   . Parkinson's disease Maternal Grandfather     late age of onset  . COPD Maternal Grandfather     former smoker  . Breast cancer Paternal Grandmother     dx early 53s - right breast; now left breast beign biopsied  . Ovarian cancer Paternal Grandmother     dx late 50s-early 60s  . Diabetes Paternal Grandmother   . Prostate cancer Paternal Grandfather     dx 38s  . Alzheimer's disease Paternal Grandfather   . COPD Paternal Grandfather   . Lupus Cousin     maternal  1st cousin  . Stomach cancer Other     maternal great aunt (MGM's mother) dx older age  . Breast cancer Paternal Aunt 30    dx breast cancer at 20, then with contralateral at 7-46; w/ mets to bone at 13y  . Lung cancer Other     paternal great uncle (PGF's brother)    ROS: Review of Systems  Constitutional: Positive for malaise/fatigue and weight loss.  Gastrointestinal: Negative for nausea and vomiting.  Musculoskeletal:       Negative muscle weakness  Psychiatric/Behavioral: Positive for depression. The patient does not have insomnia.     PHYSICAL EXAM: Blood pressure 104/73, pulse 92, temperature 98.7 F (37.1 C), temperature source Oral, height 5\' 6"  (1.676 m), weight 220 lb (99.8 kg), last menstrual period 03/17/2012, SpO2 100 %. Body mass index is 35.51 kg/m. Physical Exam  Constitutional: She is oriented to person, place, and time. She appears well-developed and well-nourished.  Cardiovascular: Normal rate.   Pulmonary/Chest: Effort normal.  Musculoskeletal: Normal range of motion.  Neurological: She is oriented to person, place, and time.  Skin: Skin is warm and dry.  Psychiatric: She has a normal mood and affect. Her behavior is normal.  Vitals reviewed.   RECENT LABS AND TESTS: BMET    Component Value Date/Time   NA 142 08/11/2016 0950   K 4.5 08/11/2016 0950   CL 104 08/11/2016 0950   CO2 24 08/11/2016 0950   GLUCOSE 78 08/11/2016 0950   GLUCOSE 71 05/27/2016 1415   BUN 11 08/11/2016 0950   CREATININE 0.81 08/11/2016 0950   CREATININE 1.04 05/27/2016 1415   CALCIUM 9.1 08/11/2016 0950   GFRNONAA 93 08/11/2016 0950   GFRAA 107 08/11/2016 0950   Lab Results  Component Value Date   HGBA1C 4.9 08/11/2016   HGBA1C 5.0 02/24/2016   Lab Results  Component Value Date   INSULIN 24.5 08/11/2016   CBC    Component Value Date/Time   WBC 5.5 08/11/2016 0950   WBC 7.4 05/27/2016 1415   RBC 4.67 08/11/2016 0950   RBC 5.13 (H) 05/27/2016 1415   HGB 14.2  05/27/2016 1415   HCT 39.6 08/11/2016 0950   PLT 241 05/27/2016 1415   MCV 85 08/11/2016 0950   MCH 28.7 08/11/2016 0950   MCH 27.7 05/27/2016 1415   MCHC 33.8 08/11/2016 0950   MCHC 32.0 05/27/2016 1415   RDW 14.0 08/11/2016 0950   LYMPHSABS 2.3 08/11/2016 0950   EOSABS 0.1 08/11/2016 0950   BASOSABS 0.0 08/11/2016 0950   Iron/TIBC/Ferritin/ %Sat    Component Value  Date/Time   IRON 33 08/11/2016 0950   TIBC 298 08/11/2016 0950   FERRITIN 31 08/11/2016 0950   IRONPCTSAT 11 (L) 08/11/2016 0950   Lipid Panel     Component Value Date/Time   CHOL 167 08/11/2016 0950   TRIG 78 08/11/2016 0950   HDL 56 08/11/2016 0950   CHOLHDL 2.7 02/24/2016 0846   VLDL 23 02/24/2016 0846   LDLCALC 95 08/11/2016 0950   Hepatic Function Panel     Component Value Date/Time   PROT 6.8 08/11/2016 0950   ALBUMIN 3.9 08/11/2016 0950   AST 19 08/11/2016 0950   ALT 16 08/11/2016 0950   ALKPHOS 65 08/11/2016 0950   BILITOT 0.3 08/11/2016 0950      Component Value Date/Time   TSH 1.320 08/11/2016 0950   TSH 1.13 02/24/2016 0846    ASSESSMENT AND PLAN: Vitamin D deficiency - Plan: Vitamin D, Ergocalciferol, (DRISDOL) 50000 units CAPS capsule  Other depression  Class 2 obesity without serious comorbidity with body mass index (BMI) of 35.0 to 35.9 in adult, unspecified obesity type  PLAN:  Vitamin D Deficiency Amy Smith was informed that low vitamin D levels contributes to fatigue and are associated with obesity, breast, and colon cancer. She agrees to continue to take prescription Vit D @50 ,000 IU every week, will refill for 1 month and will re-check labs and will follow up for routine testing of vitamin D, at least 2-3 times per year. She was informed of the risk of over-replacement of vitamin D and agrees to not increase her dose unless he discusses this with [name] first.  Depression with Emotional Eating Behaviors We discussed behavior modification techniques today to help Amy Smith deal with her  emotional eating and depression. She has agreed to continue to take Wellbutrin SR 150 and will follow up in 2 weeks.  Obesity Amy Smith is currently in the action stage of change. As such, her goal is to continue with weight loss efforts She has agreed to follow the Category 2 plan Amy Smith has been instructed to work up to a goal of 150 minutes of combined cardio and strengthening exercise per week for weight loss and overall health benefits. We discussed the following Behavioral Modification Stratagies today: increasing lean protein intake, decreasing simple carbohydrates , increasing lower sugar fruits, emotional eating strategies and no skipping meals.  Amy Smith has agreed to follow up with our clinic in 2 weeks. She was informed of the importance of frequent follow up visits to maximize her success with intensive lifestyle modifications for her multiple health conditions.  I, Doreene Nest, am acting as scribe for Dennard Nip, MD  I have reviewed the above documentation for accuracy and completeness, and I agree with the above. -Dennard Nip, MD

## 2016-09-28 DIAGNOSIS — M5126 Other intervertebral disc displacement, lumbar region: Secondary | ICD-10-CM | POA: Diagnosis not present

## 2016-09-28 DIAGNOSIS — M5137 Other intervertebral disc degeneration, lumbosacral region: Secondary | ICD-10-CM | POA: Diagnosis not present

## 2016-10-08 ENCOUNTER — Ambulatory Visit (INDEPENDENT_AMBULATORY_CARE_PROVIDER_SITE_OTHER): Payer: 59 | Admitting: Family Medicine

## 2016-10-08 VITALS — BP 107/71 | HR 90 | Temp 98.4°F | Ht 66.0 in | Wt 222.0 lb

## 2016-10-08 DIAGNOSIS — F329 Major depressive disorder, single episode, unspecified: Secondary | ICD-10-CM | POA: Diagnosis not present

## 2016-10-08 DIAGNOSIS — F32A Depression, unspecified: Secondary | ICD-10-CM

## 2016-10-08 DIAGNOSIS — E669 Obesity, unspecified: Secondary | ICD-10-CM

## 2016-10-08 MED ORDER — BUPROPION HCL ER (SR) 200 MG PO TB12: 200.0000 mg | ORAL_TABLET | Freq: Every day | ORAL | 0 refills | Status: DC

## 2016-10-09 MED FILL — BUPROPION HCL SR 200 MG TAB: 200 | 30 days supply | Qty: 30 | Fill #0

## 2016-10-12 IMAGING — MR MR LUMBAR SPINE W/O CM
4 of 6 series · 19 of 48 positions shown · non-contrast
Comparison: MRI lumbar spine 06/06/2005

CLINICAL DATA: Low back pain radiating into the left hip for 1
week. No known injury. History of prior lumbar surgery.

EXAM:
MRI LUMBAR SPINE WITHOUT CONTRAST
TECHNIQUE: Multiplanar, multisequence MR imaging of the lumbar spine was
performed. No intravenous contrast was administered.

[Series 2: T2 · sagittal · 4.0mm · 0.55mm/px · 5 of 14 slices shown (1 of 3)]
[im 1/14]
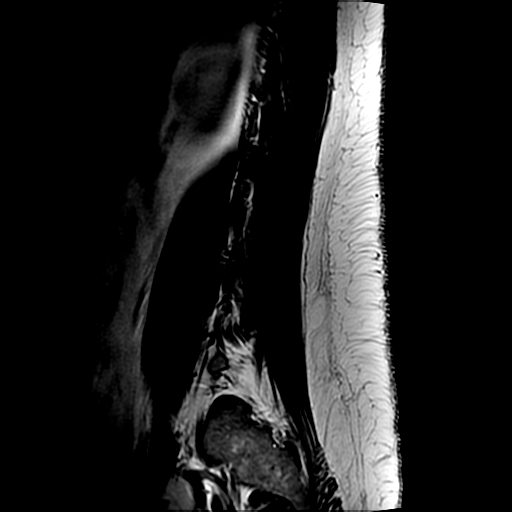
[im 4/14]
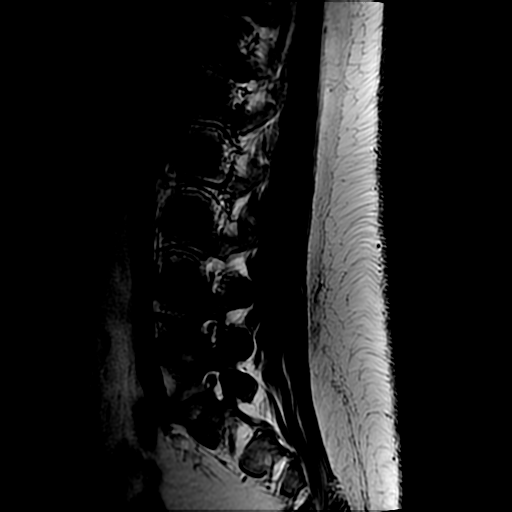
[im 7/14]
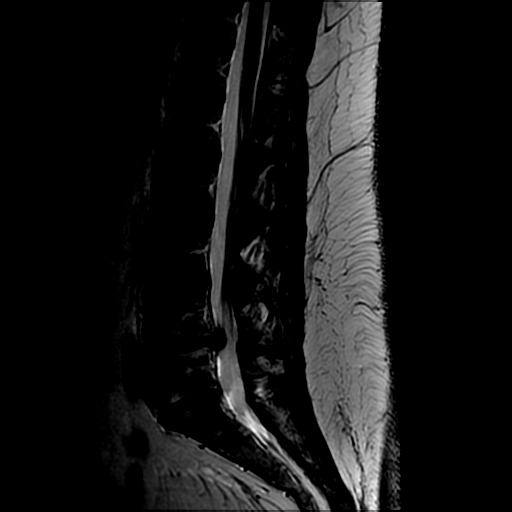
[im 10/14]
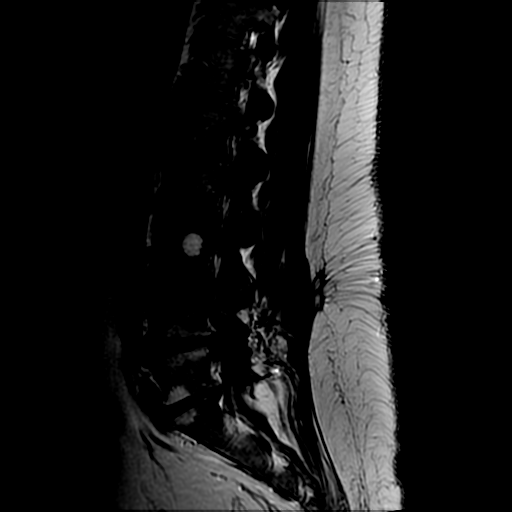
[im 14/14]
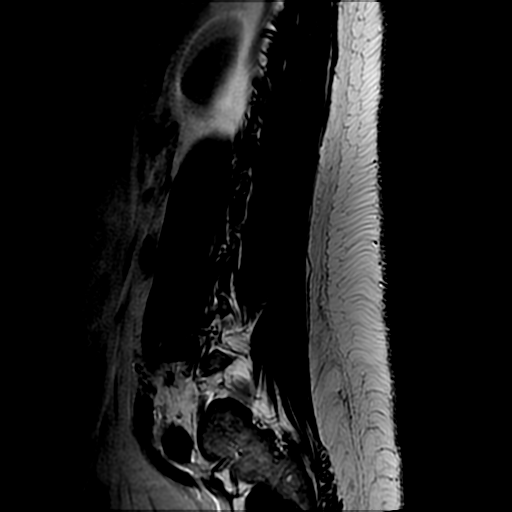

[Series 3: T2 · coronal · 4.0mm · 0.55mm/px · 5 of 15 slices shown (2 of 3)]
[im 1/15]
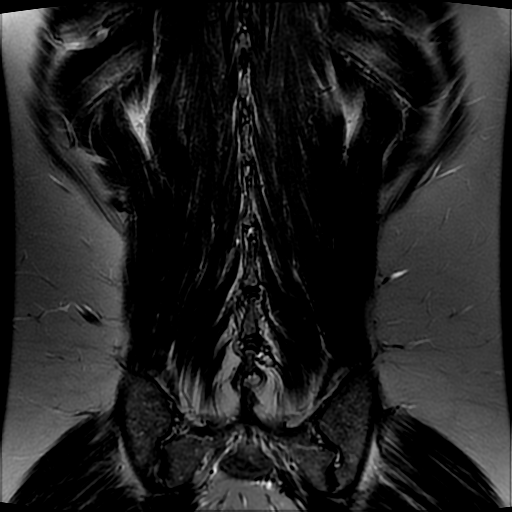
[im 4/15]
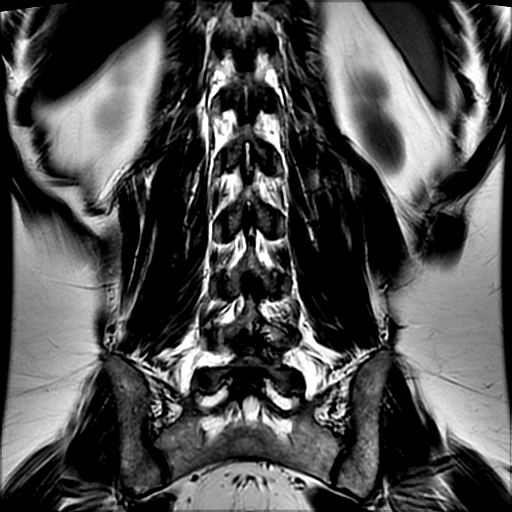
[im 8/15]
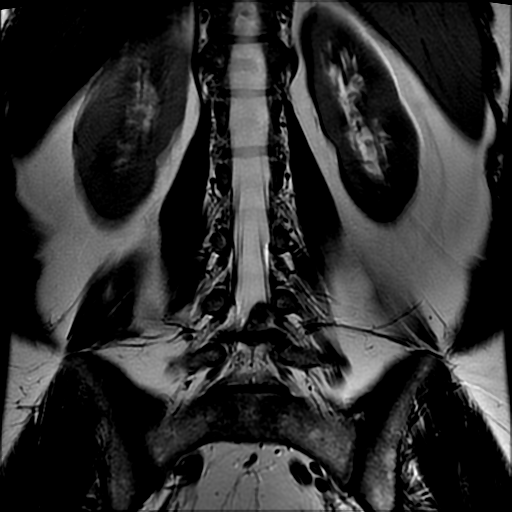
[im 11/15]
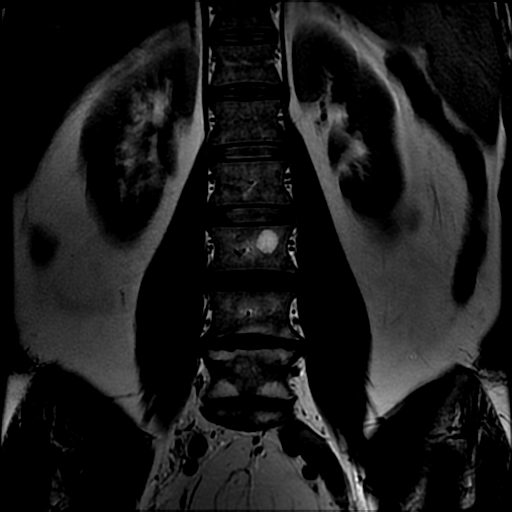
[im 15/15]
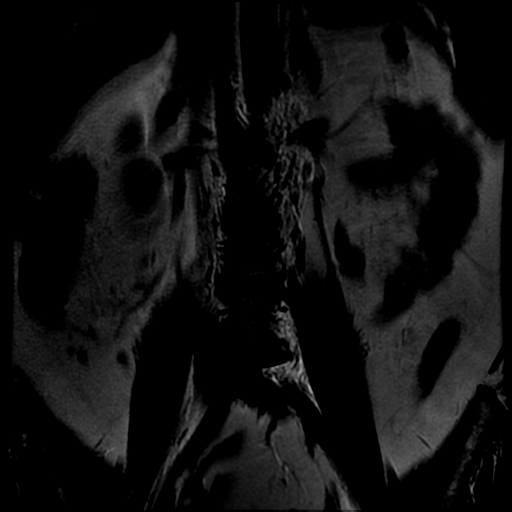

[Series 5: T1 · sagittal · 4.0mm · 0.55mm/px · 3 of 14 slices shown]
[im 1/14]
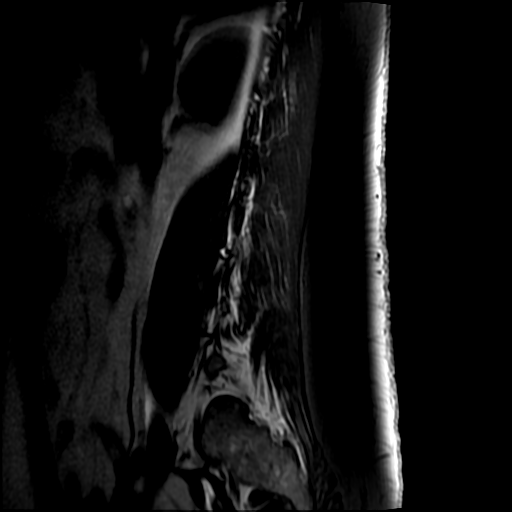
[im 7/14]
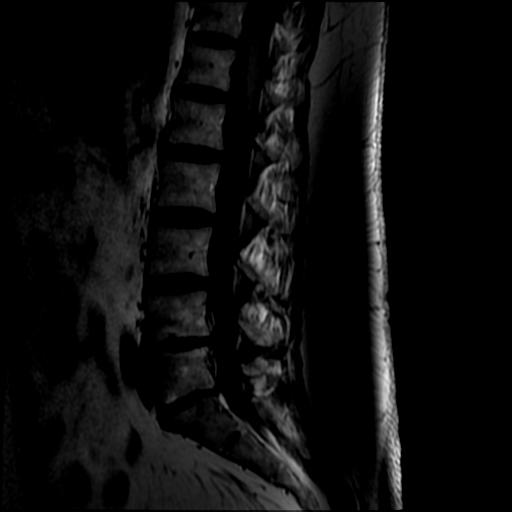
[im 14/14]
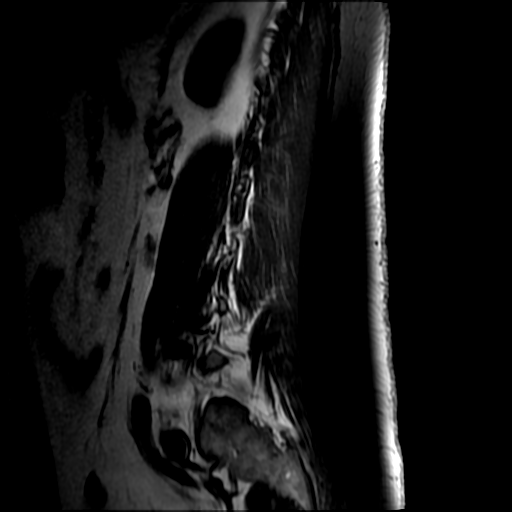

[Series 9: T2 · axial · 4.0mm · 0.39mm/px · z∈[-69,+115]mm · 6 of 40 slices shown (3 of 3)]
[im 1/40]
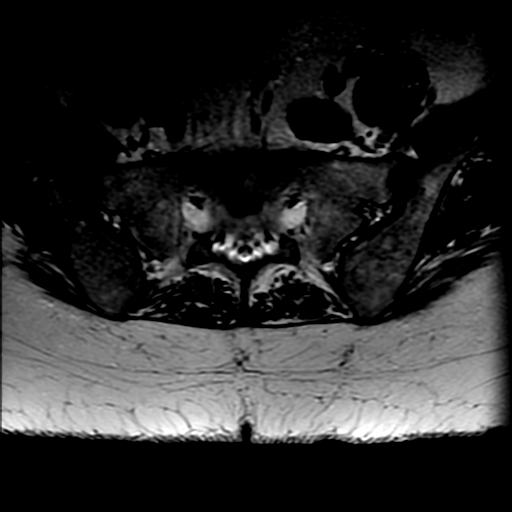
[im 7/40]
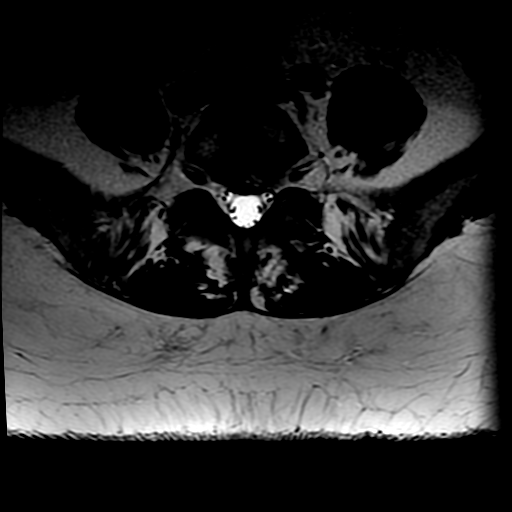
[im 13/40]
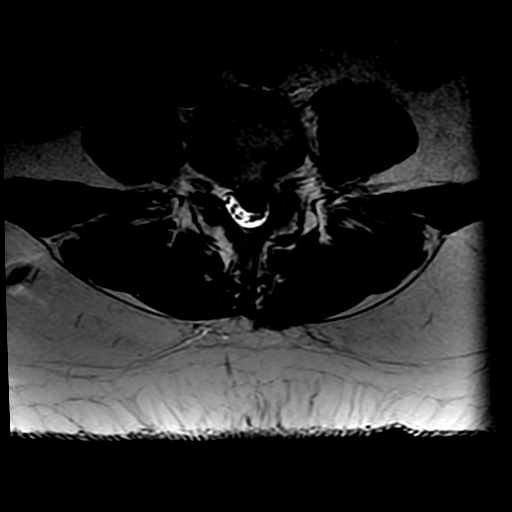
[im 19/40]
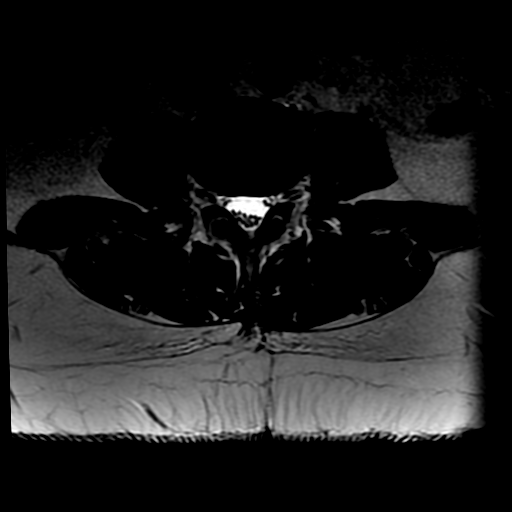
[im 22/40]
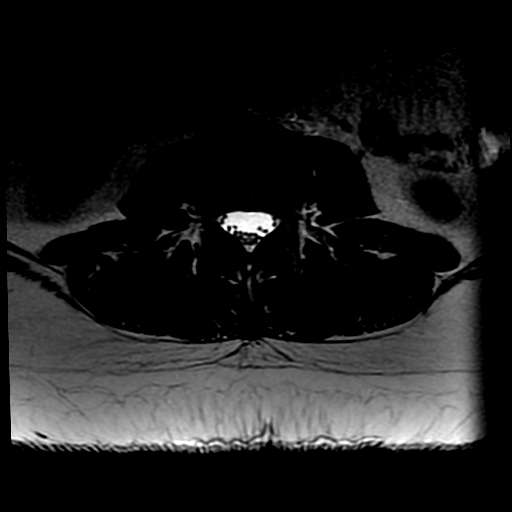
[im 34/40]
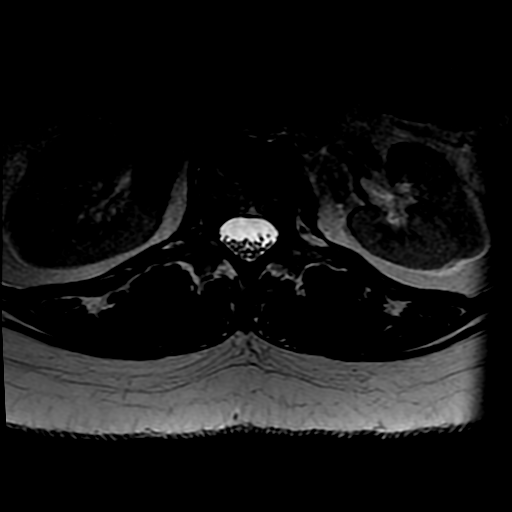

[19 of 48 positions shown; findings below may reference images not displayed]

FINDINGS: Segmentation:  Unremarkable.

Alignment:  Trace retrolisthesis L3 on L4 and L4 on L5 is seen.

Vertebrae: No fracture. Hemangioma in L3 is noted. Degenerative
endplate signal change is present at L4-5 and L5-S1.

Conus medullaris: Extends to the T12-L1 level and appears normal.

Paraspinal and other soft tissues: Unremarkable.

Disc levels:

T11-12 is imaged in the sagittal plane only and negative.

T12-L1:  Negative.

L1-2:  Negative.

L2-3:  Negative.

L3-4: Annular fissure and minimal disc bulge without central canal
or foraminal stenosis.

L4-5: Left laminotomy defect is identified. The patient has a new
very large central and eccentric to the left disc protrusion with
cephalad extension toward the left. The disc causes moderately
severe narrowing of the thecal sac at L4-5 and severe narrowing in
the left lateral recess with compression of the descending left L4
root. The foramina are open.

L5-S1: Minimal disc bulge endplate spur without central canal or
foraminal protrusion.
IMPRESSION: New very large central and to the left disc protrusion at L4-5 with
cephalad extension causes moderately severe narrowing of the thecal
sac at L4-5 and compression of the descending left L4 root. Left
laminotomy defect at L4-5 is seen as on the prior examination.

## 2016-10-12 NOTE — Progress Notes (Signed)
Office: [phone]  /  Fax: [phone]   HPI:   Chief Complaint: OBESITY Amy Smith is here to discuss her progress with her obesity treatment plan. She is following her eating plan approximately 50 % of the time and states she is exercising bike, pool, walking 1 to 2 days per week. Amy Smith is currently struggling with eating plan.  She notes increased emotional eating with increased stress at work and she is retaining some fluid today. Her weight is 222 lb (100.7 kg) today and has had a weight gain of 2 lbs over a period of 2 weeks since her last visit. She has lost 3 lbs since starting treatment with [name].  Depression with emotional eating behaviors Amy Smith is struggling with emotional eating and using food for comfort to the extent that it is negatively impacting her health. She has depressed mood with increased work stress and still notes insomnia. She often snacks when she is not hungry. Amy Smith sometimes feels she is out of control and then feels guilty that she made poor food choices. She has been working on behavior modification techniques to help reduce her emotional eating and has been minimally successful. She shows no sign of suicidal or homicidal ideations.  Depression screen PHQ 2/9 08/10/2016  Decreased Interest 2  Down, Depressed, Hopeless 1  PHQ - 2 Score 3  Altered sleeping 2  Tired, decreased energy 3  Change in appetite 3  Feeling bad or failure about yourself  1  Trouble concentrating 2  Moving slowly or fidgety/restless 1  Suicidal thoughts 0  PHQ-9 Score 15     Wt Readings from Last 500 Encounters:  10/08/16 222 lb (100.7 kg)  09/21/16 220 lb (99.8 kg)  09/08/16 222 lb (100.7 kg)  08/27/16 220 lb (99.8 kg)  08/10/16 225 lb (102.1 kg)  05/17/16 222 lb (100.7 kg)  03/26/16 221 lb (100.2 kg)  02/21/16 218 lb (98.9 kg)     ALLERGIES: Allergies  Allergen Reactions  . Cephalexin Hives  . Tramadol Other (See Comments)    Light headed & dizzy     MEDICATIONS: Current Outpatient Prescriptions on File Prior to Visit  Medication Sig Dispense Refill  . ALPRAZolam (XANAX) 0.5 MG tablet as needed.   0  . clobetasol cream (TEMOVATE) 0.05 %   1  . cyclobenzaprine (FLEXERIL) 10 MG tablet Take 10 mg by mouth 3 (three) times daily as needed for muscle spasms.    . metFORMIN (GLUCOPHAGE) 500 MG tablet Take 1 tablet (500 mg total) by mouth daily with breakfast. .5 tab every morning 15 tablet 0  . nystatin-triamcinolone (MYCOLOG II) cream Apply 1 application topically 2 (two) times daily. Apply to affected area BID for up to 7 days. 60 g 0  . valACYclovir (VALTREX) 1000 MG tablet Take 1 tablet (1,000 mg total) by mouth as needed. 30 tablet 13  . Vitamin D, Ergocalciferol, (DRISDOL) 50000 units CAPS capsule Take 1 capsule (50,000 Units total) by mouth every 7 (seven) days. 4 capsule 0   No current facility-administered medications on file prior to visit.     PAST MEDICAL HISTORY: Past Medical History:  Diagnosis Date  . Adenomyosis   . Anemia   . Anxiety   . Back pain   . DDD (degenerative disc disease), lumbar   . Dysmenorrhea   . Migraines   . Palpitations   . Ruptured lumbar disc   . Scoliosis   . STD (sexually transmitted disease)    HSV1  . Swelling   .  Vitamin D deficiency     PAST SURGICAL HISTORY: Past Surgical History:  Procedure Laterality Date  . LAMINECTOMY AND MICRODISCECTOMY SPINE  2016   L4/L5, Dr. Saintclair Halsted  . TOTAL VAGINAL HYSTERECTOMY  2013   tubes and ovaries reviewed  . TUBAL LIGATION  2006    SOCIAL HISTORY: Social History  Substance Use Topics  . Smoking status: Never Smoker  . Smokeless tobacco: Never Used  . Alcohol use 0.0 - 0.6 oz/week     Comment: 1-2 drinks per wk, "give or take, if that" (03/2016)    FAMILY HISTORY: Family History  Problem Relation Age of Onset  . Hypertension Mother   . Stroke Mother   . Other Mother 7    hx of hysterectomy for adenomyosis  . High Cholesterol Mother    . Heart disease Mother   . Obesity Mother   . Diabetes Maternal Uncle   . Parkinson's disease Maternal Uncle 35  . Cataracts Maternal Grandmother   . Parkinson's disease Maternal Grandfather     late age of onset  . COPD Maternal Grandfather     former smoker  . Breast cancer Paternal Grandmother     dx early 46s - right breast; now left breast beign biopsied  . Ovarian cancer Paternal Grandmother     dx late 50s-early 60s  . Diabetes Paternal Grandmother   . Prostate cancer Paternal Grandfather     dx 26s  . Alzheimer's disease Paternal Grandfather   . COPD Paternal Grandfather   . Lupus Cousin     maternal 1st cousin  . Stomach cancer Other     maternal great aunt (MGM's mother) dx older age  . Breast cancer Paternal Aunt 12    dx breast cancer at 56, then with contralateral at 52-46; w/ mets to bone at 56y  . Lung cancer Other     paternal great uncle (PGF's brother)    ROS: Review of Systems  Constitutional: Negative for weight loss.  Psychiatric/Behavioral: Positive for depression. Negative for suicidal ideas. The patient has insomnia.        Stress    PHYSICAL EXAM: Blood pressure 107/71, pulse 90, temperature 98.4 F (36.9 C), temperature source Oral, height 5\' 6"  (1.676 m), weight 222 lb (100.7 kg), last menstrual period 03/17/2012, SpO2 99 %. Body mass index is 35.83 kg/m. Physical Exam  Constitutional: She is oriented to person, place, and time. She appears well-developed and well-nourished.  Pulmonary/Chest: Effort normal.  Musculoskeletal: She exhibits edema (bilateral lower extremities).  Neurological: She is oriented to person, place, and time.  Skin: Skin is warm and dry.    RECENT LABS AND TESTS: BMET    Component Value Date/Time   NA 142 08/11/2016 0950   K 4.5 08/11/2016 0950   CL 104 08/11/2016 0950   CO2 24 08/11/2016 0950   GLUCOSE 78 08/11/2016 0950   GLUCOSE 71 05/27/2016 1415   BUN 11 08/11/2016 0950   CREATININE 0.81 08/11/2016  0950   CREATININE 1.04 05/27/2016 1415   CALCIUM 9.1 08/11/2016 0950   GFRNONAA 93 08/11/2016 0950   GFRAA 107 08/11/2016 0950   Lab Results  Component Value Date   HGBA1C 4.9 08/11/2016   HGBA1C 5.0 02/24/2016   Lab Results  Component Value Date   INSULIN 24.5 08/11/2016   CBC    Component Value Date/Time   WBC 5.5 08/11/2016 0950   WBC 7.4 05/27/2016 1415   RBC 4.67 08/11/2016 0950   RBC 5.13 (H) 05/27/2016 1415  HGB 14.2 05/27/2016 1415   HCT 39.6 08/11/2016 0950   PLT 241 05/27/2016 1415   MCV 85 08/11/2016 0950   MCH 28.7 08/11/2016 0950   MCH 27.7 05/27/2016 1415   MCHC 33.8 08/11/2016 0950   MCHC 32.0 05/27/2016 1415   RDW 14.0 08/11/2016 0950   LYMPHSABS 2.3 08/11/2016 0950   EOSABS 0.1 08/11/2016 0950   BASOSABS 0.0 08/11/2016 0950   Iron/TIBC/Ferritin/ %Sat    Component Value Date/Time   IRON 33 08/11/2016 0950   TIBC 298 08/11/2016 0950   FERRITIN 31 08/11/2016 0950   IRONPCTSAT 11 (L) 08/11/2016 0950   Lipid Panel     Component Value Date/Time   CHOL 167 08/11/2016 0950   TRIG 78 08/11/2016 0950   HDL 56 08/11/2016 0950   CHOLHDL 2.7 02/24/2016 0846   VLDL 23 02/24/2016 0846   LDLCALC 95 08/11/2016 0950   Hepatic Function Panel     Component Value Date/Time   PROT 6.8 08/11/2016 0950   ALBUMIN 3.9 08/11/2016 0950   AST 19 08/11/2016 0950   ALT 16 08/11/2016 0950   ALKPHOS 65 08/11/2016 0950   BILITOT 0.3 08/11/2016 0950      Component Value Date/Time   TSH 1.320 08/11/2016 0950   TSH 1.13 02/24/2016 0846    ASSESSMENT AND PLAN: Depression, unspecified depression type - Plan: buPROPion (WELLBUTRIN SR) 200 MG 12 hr tablet  Obesity (BMI 35.0-39.9 without comorbidity)  PLAN:  Depression with Emotional Eating Behaviors We discussed behavior modification techniques today to help Destyne deal with her emotional eating and depression. She has agreed to increase Wellbutrin SR to 200 mg every morning #30 with no refills and agreed to  follow up as directed.  Obesity Sherrice is currently in the action stage of change. As such, her goal is to continue with weight loss efforts She has agreed to keep a food journal with 1200 to 1400 calories and 75+ grams of protein daily Anquanette has been instructed to work up to a goal of 150 minutes of combined cardio and strengthening exercise per week or continue exercise as is for weight loss and overall health benefits. We discussed the following Behavioral Modification Stratagies today: increasing water, increasing lean protein intake and decreasing simple carbohydrates   Allyse has agreed to follow up with our clinic in 2 weeks. She was informed of the importance of frequent follow up visits to maximize her success with intensive lifestyle modifications for her multiple health conditions.  I, Doreene Nest, am acting as scribe for Dennard Nip, MD  I have reviewed the above documentation for accuracy and completeness, and I agree with the above. -Dennard Nip, MD

## 2016-10-15 ENCOUNTER — Encounter (INDEPENDENT_AMBULATORY_CARE_PROVIDER_SITE_OTHER): Payer: Self-pay | Admitting: Family Medicine

## 2016-10-15 NOTE — Telephone Encounter (Signed)
Reschedule request

## 2016-10-15 NOTE — Telephone Encounter (Signed)
Can you help with this?

## 2016-10-16 ENCOUNTER — Encounter: Payer: Self-pay | Admitting: Nurse Practitioner

## 2016-10-16 ENCOUNTER — Ambulatory Visit (INDEPENDENT_AMBULATORY_CARE_PROVIDER_SITE_OTHER): Payer: 59 | Admitting: Nurse Practitioner

## 2016-10-16 VITALS — BP 100/68 | HR 98

## 2016-10-16 DIAGNOSIS — L918 Other hypertrophic disorders of the skin: Secondary | ICD-10-CM | POA: Diagnosis not present

## 2016-10-16 NOTE — Patient Instructions (Signed)
Keep area clean and dry. °

## 2016-10-16 NOTE — Progress Notes (Signed)
36 y.o. Married Caucasian female [phone] here for 3 skin tags that are located between the breast and cause irritation with bra wear.    O: Healthy WD,WN female Affect: normal Skin: 3 small skin tags between the breast - areas are cleaned with betadine and clipped off.  Minimal bleeding.  Area has a gauze dressing to avoid irritation with bra wear    A: Skin tags     P:  Discussed wound care.  Report any signs of infection    RV

## 2016-10-18 NOTE — Progress Notes (Signed)
Encounter reviewed by Dr. Neziah Vogelgesang Amundson C. Silva.  

## 2016-10-25 ENCOUNTER — Encounter (INDEPENDENT_AMBULATORY_CARE_PROVIDER_SITE_OTHER): Payer: Self-pay | Admitting: Family Medicine

## 2016-10-27 ENCOUNTER — Ambulatory Visit (INDEPENDENT_AMBULATORY_CARE_PROVIDER_SITE_OTHER): Payer: 59 | Admitting: Family Medicine

## 2016-10-29 ENCOUNTER — Other Ambulatory Visit: Payer: Self-pay

## 2016-10-29 ENCOUNTER — Ambulatory Visit (INDEPENDENT_AMBULATORY_CARE_PROVIDER_SITE_OTHER): Payer: 59 | Admitting: Family Medicine

## 2016-10-29 VITALS — BP 97/70 | HR 85 | Temp 98.6°F | Ht 66.0 in | Wt 220.0 lb

## 2016-10-29 DIAGNOSIS — E559 Vitamin D deficiency, unspecified: Secondary | ICD-10-CM | POA: Diagnosis not present

## 2016-10-29 DIAGNOSIS — Z6835 Body mass index (BMI) 35.0-35.9, adult: Secondary | ICD-10-CM

## 2016-10-29 DIAGNOSIS — E669 Obesity, unspecified: Secondary | ICD-10-CM

## 2016-10-29 MED ORDER — VITAMIN D (ERGOCALCIFEROL) 1.25 MG (50000 UNIT) PO CAPS: 50000.0000 [IU] | ORAL_CAPSULE | ORAL | 0 refills | Status: DC

## 2016-10-29 MED FILL — valACYclovir HCL 1 GM TABS: 1 | 30 days supply | Qty: 30 | Fill #0

## 2016-10-29 MED FILL — VIT D2 1.25 MG (50,000 UNIT: 1.25 MG | 28 days supply | Qty: 4 | Fill #0

## 2016-10-29 NOTE — Progress Notes (Signed)
Office: [phone]  /  Fax: [phone]   HPI:   Chief Complaint: OBESITY Amy Smith is here to discuss her progress with her obesity treatment plan. She is following her eating plan approximately 100 % of the time and states she is walking, gym, PT in pool for exercise 1 time per week. Amy Smith continues to do well with weight loss. She notes some family sabotage. Hunger is controlled for the most part and she is working on decreasing stress eating at work. Her protein has been decreased and she notes increased fatigue. Her weight is 220 lb (99.8 kg) today and has had a weight loss of 2 pounds over a period of 2 to 3 weeks since her last visit. She has lost 5 lbs since starting treatment with [name].  Vitamin D deficiency Amy Smith has a diagnosis of vitamin D deficiency. She is currently taking vit D and not yet at goal. She denies nausea, vomiting or muscle weakness.  Wt Readings from Last 500 Encounters:  10/29/16 220 lb (99.8 kg)  10/08/16 222 lb (100.7 kg)  09/21/16 220 lb (99.8 kg)  09/08/16 222 lb (100.7 kg)  08/27/16 220 lb (99.8 kg)  08/10/16 225 lb (102.1 kg)  05/17/16 222 lb (100.7 kg)  03/26/16 221 lb (100.2 kg)  02/21/16 218 lb (98.9 kg)     ALLERGIES: Allergies  Allergen Reactions  . Cephalexin Hives  . Tramadol Other (See Comments)    Light headed & dizzy    MEDICATIONS: Current Outpatient Prescriptions on File Prior to Visit  Medication Sig Dispense Refill  . ALPRAZolam (XANAX) 0.5 MG tablet as needed.   0  . buPROPion (WELLBUTRIN SR) 200 MG 12 hr tablet Take 1 tablet (200 mg total) by mouth daily. 30 tablet 0  . clobetasol cream (TEMOVATE) 0.05 %   1  . cyclobenzaprine (FLEXERIL) 10 MG tablet Take 10 mg by mouth 3 (three) times daily as needed for muscle spasms.    . metFORMIN (GLUCOPHAGE) 500 MG tablet Take 1 tablet (500 mg total) by mouth daily with breakfast. .5 tab every morning 15 tablet 0  . nystatin-triamcinolone (MYCOLOG II) cream Apply 1 application  topically 2 (two) times daily. Apply to affected area BID for up to 7 days. 60 g 0  . valACYclovir (VALTREX) 1000 MG tablet Take 1 tablet (1,000 mg total) by mouth as needed. 30 tablet 13  . Vitamin D, Ergocalciferol, (DRISDOL) 50000 units CAPS capsule Take 1 capsule (50,000 Units total) by mouth every 7 (seven) days. 4 capsule 0   No current facility-administered medications on file prior to visit.     PAST MEDICAL HISTORY: Past Medical History:  Diagnosis Date  . Adenomyosis   . Anemia   . Anxiety   . Back pain   . DDD (degenerative disc disease), lumbar   . Dysmenorrhea   . Migraines   . Palpitations   . Ruptured lumbar disc   . Scoliosis   . STD (sexually transmitted disease)    HSV1  . Swelling   . Vitamin D deficiency     PAST SURGICAL HISTORY: Past Surgical History:  Procedure Laterality Date  . LAMINECTOMY AND MICRODISCECTOMY SPINE  2016   L4/L5, Dr. Saintclair Halsted  . TOTAL VAGINAL HYSTERECTOMY  2013   tubes and ovaries reviewed  . TUBAL LIGATION  2006    SOCIAL HISTORY: Social History  Substance Use Topics  . Smoking status: Never Smoker  . Smokeless tobacco: Never Used  . Alcohol use 0.0 - 0.6 oz/week  Comment: 1-2 drinks per wk, "give or take, if that" (03/2016)    FAMILY HISTORY: Family History  Problem Relation Age of Onset  . Hypertension Mother   . Stroke Mother   . Other Mother 30    hx of hysterectomy for adenomyosis  . High Cholesterol Mother   . Heart disease Mother   . Obesity Mother   . Diabetes Maternal Uncle   . Parkinson's disease Maternal Uncle 83  . Cataracts Maternal Grandmother   . Parkinson's disease Maternal Grandfather     late age of onset  . COPD Maternal Grandfather     former smoker  . Breast cancer Paternal Grandmother     dx early 66s - right breast; now left breast beign biopsied  . Ovarian cancer Paternal Grandmother     dx late 50s-early 60s  . Diabetes Paternal Grandmother   . Prostate cancer Paternal Grandfather      dx 15s  . Alzheimer's disease Paternal Grandfather   . COPD Paternal Grandfather   . Lupus Cousin     maternal 1st cousin  . Stomach cancer Other     maternal great aunt (MGM's mother) dx older age  . Breast cancer Paternal Aunt 45    dx breast cancer at 25, then with contralateral at 106-46; w/ mets to bone at 48y  . Lung cancer Other     paternal great uncle (PGF's brother)    ROS: Review of Systems  Constitutional: Positive for malaise/fatigue.  Gastrointestinal: Negative for nausea and vomiting.  Musculoskeletal:       Negative muscle weakness    PHYSICAL EXAM: Blood pressure 97/70, pulse 85, temperature 98.6 F (37 C), temperature source Oral, height 5\' 6"  (1.676 m), weight 220 lb (99.8 kg), last menstrual period 03/17/2012, SpO2 97 %. Body mass index is 35.51 kg/m. Physical Exam  Constitutional: She is oriented to person, place, and time. She appears well-developed and well-nourished.  Cardiovascular: Normal rate.   Pulmonary/Chest: Effort normal.  Musculoskeletal: Normal range of motion.  Neurological: She is oriented to person, place, and time.  Skin: Skin is warm and dry.  Psychiatric: She has a normal mood and affect. Her behavior is normal.  Vitals reviewed.   RECENT LABS AND TESTS: BMET    Component Value Date/Time   NA 142 08/11/2016 0950   K 4.5 08/11/2016 0950   CL 104 08/11/2016 0950   CO2 24 08/11/2016 0950   GLUCOSE 78 08/11/2016 0950   GLUCOSE 71 05/27/2016 1415   BUN 11 08/11/2016 0950   CREATININE 0.81 08/11/2016 0950   CREATININE 1.04 05/27/2016 1415   CALCIUM 9.1 08/11/2016 0950   GFRNONAA 93 08/11/2016 0950   GFRAA 107 08/11/2016 0950   Lab Results  Component Value Date   HGBA1C 4.9 08/11/2016   HGBA1C 5.0 02/24/2016   Lab Results  Component Value Date   INSULIN 24.5 08/11/2016   CBC    Component Value Date/Time   WBC 5.5 08/11/2016 0950   WBC 7.4 05/27/2016 1415   RBC 4.67 08/11/2016 0950   RBC 5.13 (H) 05/27/2016 1415    HGB 14.2 05/27/2016 1415   HCT 39.6 08/11/2016 0950   PLT 241 05/27/2016 1415   MCV 85 08/11/2016 0950   MCH 28.7 08/11/2016 0950   MCH 27.7 05/27/2016 1415   MCHC 33.8 08/11/2016 0950   MCHC 32.0 05/27/2016 1415   RDW 14.0 08/11/2016 0950   LYMPHSABS 2.3 08/11/2016 0950   EOSABS 0.1 08/11/2016 0950   BASOSABS 0.0  08/11/2016 0950   Iron/TIBC/Ferritin/ %Sat    Component Value Date/Time   IRON 33 08/11/2016 0950   TIBC 298 08/11/2016 0950   FERRITIN 31 08/11/2016 0950   IRONPCTSAT 11 (L) 08/11/2016 0950   Lipid Panel     Component Value Date/Time   CHOL 167 08/11/2016 0950   TRIG 78 08/11/2016 0950   HDL 56 08/11/2016 0950   CHOLHDL 2.7 02/24/2016 0846   VLDL 23 02/24/2016 0846   LDLCALC 95 08/11/2016 0950   Hepatic Function Panel     Component Value Date/Time   PROT 6.8 08/11/2016 0950   ALBUMIN 3.9 08/11/2016 0950   AST 19 08/11/2016 0950   ALT 16 08/11/2016 0950   ALKPHOS 65 08/11/2016 0950   BILITOT 0.3 08/11/2016 0950      Component Value Date/Time   TSH 1.320 08/11/2016 0950   TSH 1.13 02/24/2016 0846    ASSESSMENT AND PLAN: Vitamin D deficiency - Plan: Vitamin D, Ergocalciferol, (DRISDOL) 50000 units CAPS capsule  Class 2 obesity without serious comorbidity with body mass index (BMI) of 35.0 to 35.9 in adult, unspecified obesity type  PLAN:  Vitamin D Deficiency Amy Smith was informed that low vitamin D levels contributes to fatigue and are associated with obesity, breast, and colon cancer. She agrees to continue to take prescription Vit D @50 ,000 IU every week, prescription was written for 1 month refill and we will re-check labs in 2 weeks and will follow up for routine testing of vitamin D, at least 2-3 times per year. She was informed of the risk of over-replacement of vitamin D and agrees to not increase her dose unless he discusses this with [name] first.  Obesity Amy Smith is currently in the action stage of change. As such, her goal is to continue with  weight loss efforts She has agreed to keep a food journal with 1200 to 1400 calories and 75+ grams of protein daily Amy Smith has been instructed to work up to a goal of 150 minutes of combined cardio and strengthening exercise per week for weight loss and overall health benefits. We discussed the following Behavioral Modification Stratagies today: increasing lean protein intake, dealing with family or coworker sabotage and emotional eating strategies  Amy Smith has agreed to follow up with our clinic in 2 weeks. She was informed of the importance of frequent follow up visits to maximize her success with intensive lifestyle modifications for her multiple health conditions.  I, Doreene Nest, am acting as scribe for Dennard Nip, MD  I have reviewed the above documentation for accuracy and completeness, and I agree with the above. -Dennard Nip, MD

## 2016-10-29 NOTE — Telephone Encounter (Signed)
Opened in error

## 2016-10-30 ENCOUNTER — Telehealth: Payer: Self-pay | Admitting: Obstetrics & Gynecology

## 2016-10-30 NOTE — Telephone Encounter (Signed)
Spoke with Amy Smith at [facility] calling to clarify frequency on Valtrex RX dated 02/21/16. Advised 1 tablet daily prn.  Routing to provider for final review. Patient is agreeable to disposition. Will close encounter.

## 2016-10-30 NOTE — Telephone Encounter (Signed)
Pharmacy is calling about a prescription written in June. They need instructions and clarification.

## 2016-11-04 ENCOUNTER — Ambulatory Visit (INDEPENDENT_AMBULATORY_CARE_PROVIDER_SITE_OTHER): Payer: 59 | Admitting: Family Medicine

## 2016-11-09 ENCOUNTER — Encounter (INDEPENDENT_AMBULATORY_CARE_PROVIDER_SITE_OTHER): Payer: Self-pay | Admitting: Family Medicine

## 2016-11-18 ENCOUNTER — Ambulatory Visit (INDEPENDENT_AMBULATORY_CARE_PROVIDER_SITE_OTHER): Payer: 59 | Admitting: Family Medicine

## 2016-11-18 VITALS — BP 100/67 | HR 87 | Temp 98.0°F | Ht 66.0 in | Wt 218.0 lb

## 2016-11-18 DIAGNOSIS — R7303 Prediabetes: Secondary | ICD-10-CM | POA: Diagnosis not present

## 2016-11-18 DIAGNOSIS — F3289 Other specified depressive episodes: Secondary | ICD-10-CM | POA: Diagnosis not present

## 2016-11-18 DIAGNOSIS — E669 Obesity, unspecified: Secondary | ICD-10-CM | POA: Diagnosis not present

## 2016-11-18 DIAGNOSIS — Z6835 Body mass index (BMI) 35.0-35.9, adult: Secondary | ICD-10-CM | POA: Diagnosis not present

## 2016-11-18 DIAGNOSIS — E559 Vitamin D deficiency, unspecified: Secondary | ICD-10-CM

## 2016-11-18 DIAGNOSIS — K5909 Other constipation: Secondary | ICD-10-CM | POA: Diagnosis not present

## 2016-11-18 MED ORDER — BUPROPION HCL ER (SR) 200 MG PO TB12: 200.0000 mg | ORAL_TABLET | Freq: Every day | ORAL | 0 refills | Status: AC

## 2016-11-18 MED ORDER — POLYETHYLENE GLYCOL 3350 17 GM/SCOOP PO POWD: 17.0000 g | Freq: Every day | ORAL | 0 refills | Status: AC

## 2016-11-18 MED ORDER — METFORMIN HCL 500 MG PO TABS: ORAL_TABLET | ORAL | 0 refills | Status: AC

## 2016-11-18 MED FILL — BUPROPION HCL SR 200 MG TAB: 200 | 30 days supply | Qty: 30 | Fill #0

## 2016-11-18 MED FILL — metFORMIN HCL 500 MG TABS: 500 | 30 days supply | Qty: 30 | Fill #0

## 2016-11-18 MED FILL — POLYETHYLENE GLYCOL 3350 PO: 90 days supply | Qty: 1581 | Fill #0

## 2016-11-18 NOTE — Progress Notes (Signed)
Office: [phone]  /  Fax: [phone]   HPI:   Chief Complaint: OBESITY Amy Smith is here to discuss her progress with her obesity treatment plan. She is following her eating plan approximately 85 % of the time and states she is exercising 0 minutes 0 times per week. Amy Smith continues to do well with weight loss, is journaling well but still struggles to meet her protein goal. Her weight is 218 lb (98.9 kg) today and has had a weight loss of 2 pounds over a period of 2 weeks since her last visit. She has lost 7 lbs since starting treatment with [name].  Depression with emotional eating behaviors Amy Smith notes decreased emotional eating, mood is stable. She is using food for comfort to the extent that it is negatively impacting her health. She often snacks when she is not hungry. Sariah sometimes feels she is out of control and then feels guilty that she made poor food choices. She has been working on behavior modification techniques to help reduce her emotional eating and has been somewhat successful. She shows no sign of suicidal or homicidal ideations.  Pre-Diabetes Amy Smith has a diagnosis of prediabetes based on her elevated Hgb A1c and was informed this puts her at greater risk of developing diabetes. She is taking metformin 250 mg every morning but still notes evening polyphagia. She currently continues to work on diet and exercise to decrease risk of diabetes. She denies nausea or hypoglycemia.  Constipation Amy Smith notes feeling bloated and constipated, especially over the last 3 days. She states BM are less frequent and are not hard and painful. She denies abdominal pain, hematochezia or melena. She denies drinking less H20 recently.  Vitamin D deficiency Amy Smith has a diagnosis of vitamin D deficiency. She is currently stable on vit D, not yet at goal and denies nausea, vomiting or muscle weakness.  Depression screen Amy Smith 2/9 08/10/2016  Decreased Interest 2  Down, Depressed, Hopeless 1    PHQ - 2 Score 3  Altered sleeping 2  Tired, decreased energy 3  Change in appetite 3  Feeling bad or failure about yourself  1  Trouble concentrating 2  Moving slowly or fidgety/restless 1  Suicidal thoughts 0  PHQ-9 Score 15     Wt Readings from Last 500 Encounters:  11/18/16 218 lb (98.9 kg)  10/29/16 220 lb (99.8 kg)  10/08/16 222 lb (100.7 kg)  09/21/16 220 lb (99.8 kg)  09/08/16 222 lb (100.7 kg)  08/27/16 220 lb (99.8 kg)  08/10/16 225 lb (102.1 kg)  05/17/16 222 lb (100.7 kg)  03/26/16 221 lb (100.2 kg)  02/21/16 218 lb (98.9 kg)     ALLERGIES: Allergies  Allergen Reactions  . Cephalexin Hives  . Tramadol Other (See Comments)    Light headed & dizzy    MEDICATIONS: Current Outpatient Prescriptions on File Prior to Visit  Medication Sig Dispense Refill  . ALPRAZolam (XANAX) 0.5 MG tablet as needed.   0  . buPROPion (WELLBUTRIN SR) 200 MG 12 hr tablet Take 1 tablet (200 mg total) by mouth daily. 30 tablet 0  . clobetasol cream (TEMOVATE) 0.05 %   1  . cyclobenzaprine (FLEXERIL) 10 MG tablet Take 10 mg by mouth 3 (three) times daily as needed for muscle spasms.    Marland Kitchen nystatin-triamcinolone (MYCOLOG II) cream Apply 1 application topically 2 (two) times daily. Apply to affected area BID for up to 7 days. 60 g 0  . valACYclovir (VALTREX) 1000 MG tablet Take 1 tablet (1,000  mg total) by mouth as needed. 30 tablet 13  . Vitamin D, Ergocalciferol, (DRISDOL) 50000 units CAPS capsule Take 1 capsule (50,000 Units total) by mouth every 7 (seven) days. 4 capsule 0   No current facility-administered medications on file prior to visit.     PAST MEDICAL HISTORY: Past Medical History:  Diagnosis Date  . Adenomyosis   . Anemia   . Anxiety   . Back pain   . DDD (degenerative disc disease), lumbar   . Dysmenorrhea   . Migraines   . Palpitations   . Ruptured lumbar disc   . Scoliosis   . STD (sexually transmitted disease)    HSV1  . Swelling   . Vitamin D deficiency      PAST SURGICAL HISTORY: Past Surgical History:  Procedure Laterality Date  . LAMINECTOMY AND MICRODISCECTOMY SPINE  2016   L4/L5, Dr. Saintclair Halsted  . TOTAL VAGINAL HYSTERECTOMY  2013   tubes and ovaries reviewed  . TUBAL LIGATION  2006    SOCIAL HISTORY: Social History  Substance Use Topics  . Smoking status: Never Smoker  . Smokeless tobacco: Never Used  . Alcohol use 0.0 - 0.6 oz/week     Comment: 1-2 drinks per wk, "give or take, if that" (03/2016)    FAMILY HISTORY: Family History  Problem Relation Age of Onset  . Hypertension Mother   . Stroke Mother   . Other Mother 59    hx of hysterectomy for adenomyosis  . High Cholesterol Mother   . Heart disease Mother   . Obesity Mother   . Diabetes Maternal Uncle   . Parkinson's disease Maternal Uncle 47  . Cataracts Maternal Grandmother   . Parkinson's disease Maternal Grandfather     late age of onset  . COPD Maternal Grandfather     former smoker  . Breast cancer Paternal Grandmother     dx early 72s - right breast; now left breast beign biopsied  . Ovarian cancer Paternal Grandmother     dx late 50s-early 60s  . Diabetes Paternal Grandmother   . Prostate cancer Paternal Grandfather     dx 75s  . Alzheimer's disease Paternal Grandfather   . COPD Paternal Grandfather   . Lupus Cousin     maternal 1st cousin  . Stomach cancer Other     maternal great aunt (MGM's mother) dx older age  . Breast cancer Paternal Aunt 85    dx breast cancer at 54, then with contralateral at 1-46; w/ mets to bone at 70y  . Lung cancer Other     paternal great uncle (PGF's brother)    ROS: Review of Systems  Constitutional: Positive for weight loss.  Gastrointestinal: Positive for constipation. Negative for abdominal pain, melena and nausea.       Abdmonial bloating  Musculoskeletal:       Muscle weakness  Endo/Heme/Allergies:       Polyphagia Negative hypoglycemia  Psychiatric/Behavioral: Positive for depression. Negative for  suicidal ideas.    PHYSICAL EXAM: Blood pressure 100/67, pulse 87, temperature 98 F (36.7 C), temperature source Oral, height 5\' 6"  (1.676 m), weight 218 lb (98.9 kg), last menstrual period 03/17/2012, SpO2 98 %. Body mass index is 35.19 kg/m. Physical Exam  Constitutional: She is oriented to person, place, and time. She appears well-developed and well-nourished.  Cardiovascular: Normal rate.   Pulmonary/Chest: Effort normal.  Musculoskeletal: Normal range of motion.  Neurological: She is oriented to person, place, and time.  Skin: Skin is warm  and dry.  Vitals reviewed.   RECENT LABS AND TESTS: BMET    Component Value Date/Time   NA 142 08/11/2016 0950   K 4.5 08/11/2016 0950   CL 104 08/11/2016 0950   CO2 24 08/11/2016 0950   GLUCOSE 78 08/11/2016 0950   GLUCOSE 71 05/27/2016 1415   BUN 11 08/11/2016 0950   CREATININE 0.81 08/11/2016 0950   CREATININE 1.04 05/27/2016 1415   CALCIUM 9.1 08/11/2016 0950   GFRNONAA 93 08/11/2016 0950   GFRAA 107 08/11/2016 0950   Lab Results  Component Value Date   HGBA1C 4.9 08/11/2016   HGBA1C 5.0 02/24/2016   Lab Results  Component Value Date   INSULIN 24.5 08/11/2016   CBC    Component Value Date/Time   WBC 5.5 08/11/2016 0950   WBC 7.4 05/27/2016 1415   RBC 4.67 08/11/2016 0950   RBC 5.13 (H) 05/27/2016 1415   HGB 14.2 05/27/2016 1415   HCT 39.6 08/11/2016 0950   PLT 241 05/27/2016 1415   MCV 85 08/11/2016 0950   MCH 28.7 08/11/2016 0950   MCH 27.7 05/27/2016 1415   MCHC 33.8 08/11/2016 0950   MCHC 32.0 05/27/2016 1415   RDW 14.0 08/11/2016 0950   LYMPHSABS 2.3 08/11/2016 0950   EOSABS 0.1 08/11/2016 0950   BASOSABS 0.0 08/11/2016 0950   Iron/TIBC/Ferritin/ %Sat    Component Value Date/Time   IRON 33 08/11/2016 0950   TIBC 298 08/11/2016 0950   FERRITIN 31 08/11/2016 0950   IRONPCTSAT 11 (L) 08/11/2016 0950   Lipid Panel     Component Value Date/Time   CHOL 167 08/11/2016 0950   TRIG 78 08/11/2016 0950     HDL 56 08/11/2016 0950   CHOLHDL 2.7 02/24/2016 0846   VLDL 23 02/24/2016 0846   LDLCALC 95 08/11/2016 0950   Hepatic Function Panel     Component Value Date/Time   PROT 6.8 08/11/2016 0950   ALBUMIN 3.9 08/11/2016 0950   AST 19 08/11/2016 0950   ALT 16 08/11/2016 0950   ALKPHOS 65 08/11/2016 0950   BILITOT 0.3 08/11/2016 0950      Component Value Date/Time   TSH 1.320 08/11/2016 0950   TSH 1.13 02/24/2016 0846    ASSESSMENT AND PLAN: Other depression - Plan: buPROPion (WELLBUTRIN SR) 200 MG 12 hr tablet  Prediabetes - Plan: Comprehensive metabolic panel, Hemoglobin A1c, Insulin, random, metFORMIN (GLUCOPHAGE) 500 MG tablet  Other constipation - Plan: polyethylene glycol powder (GLYCOLAX/MIRALAX) powder  Vitamin D deficiency - Plan: VITAMIN D 25 Hydroxy (Vit-D Deficiency, Fractures)  Class 2 obesity without serious comorbidity with body mass index (BMI) of 35.0 to 35.9 in adult, unspecified obesity type  PLAN:  Depression with Emotional Eating Behaviors We discussed behavior modification techniques today to help Haneefah deal with her emotional eating and depression. She has agreed to continue to take Wellbutrin SR 150 mg qd, we will refill for 1 month and she agreed to follow up as directed.  Pre-Diabetes Debara will continue to work on weight loss, exercise, and decreasing simple carbohydrates in her diet to help decrease the risk of diabetes. We dicussed metformin including benefits and risks. She was informed that eating too many simple carbohydrates or too many calories at one sitting increases the likelihood of GI side effects. Aamari agrees to increase metformin to 250 mg bid for now and a prescription refill was not needed today. We will check labs and Kortney agreed to follow up with [name] as directed to monitor her progress.  Constipation Saanvika agrees to start Miralax 17 grams or 1 capful daily for 30 days with no refills and agrees to follow up with our clinic in 2  weeks.  Vitamin D Deficiency Lavera was informed that low vitamin D levels contributes to fatigue and are associated with obesity, breast, and colon cancer. She agrees to continue to take prescription Vit D @50 ,000 IU every week, we will check labs and will follow up for routine testing of vitamin D, at least 2-3 times per year. She was informed of the risk of over-replacement of vitamin D and agrees to not increase her dose unless he discusses this with [name] first. Stormee agrees to follow up with our clinic in 2 weeks.  Obesity Alma is currently in the action stage of change. As such, her goal is to continue with weight loss efforts She has agreed to keep a food journal with 1200 to 1400 calories and 75+ grams of protein daily Ladelle has been instructed to work up to a goal of 150 minutes of combined cardio and strengthening exercise per week for weight loss and overall health benefits. We discussed the following Behavioral Modification Stratagies today: increasing lean protein intake, decreasing simple carbohydrates , increasing lower sugar fruits and increasing fiber rich foods  Lemon has agreed to follow up with our clinic in 2 weeks. She was informed of the importance of frequent follow up visits to maximize her success with intensive lifestyle modifications for her multiple health conditions.  I, Doreene Nest, am acting as scribe for Dennard Nip, MD  I have reviewed the above documentation for accuracy and completeness, and I agree with the above. -Dennard Nip, MD

## 2016-11-19 LAB — COMPREHENSIVE METABOLIC PANEL
ALT: 12 IU/L (ref 0–32)
AST: 13 IU/L (ref 0–40)
Albumin/Globulin Ratio: 1.5 (ref 1.2–2.2)
Albumin: 4.1 g/dL (ref 3.5–5.5)
Alkaline Phosphatase: 65 IU/L (ref 39–117)
BUN/Creatinine Ratio: 11 (ref 9–23)
BUN: 10 mg/dL (ref 6–20)
Bilirubin Total: 0.3 mg/dL (ref 0.0–1.2)
CALCIUM: 9.2 mg/dL (ref 8.7–10.2)
CO2: 24 mmol/L (ref 18–29)
Chloride: 100 mmol/L (ref 96–106)
Creatinine, Ser: 0.91 mg/dL (ref 0.57–1.00)
GFR, EST AFRICAN AMERICAN: 93 mL/min/{1.73_m2} (ref >59)
GFR, EST NON AFRICAN AMERICAN: 80 mL/min/{1.73_m2} (ref >59)
GLUCOSE: 79 mg/dL (ref 65–99)
Globulin, Total: 2.8 g/dL (ref 1.5–4.5)
Potassium: 4.6 mmol/L (ref 3.5–5.2)
Sodium: 138 mmol/L (ref 134–144)
TOTAL PROTEIN: 6.9 g/dL (ref 6.0–8.5)

## 2016-11-19 LAB — VITAMIN D 25 HYDROXY (VIT D DEFICIENCY, FRACTURES): VIT D 25 HYDROXY: 20.7 ng/mL — AB (ref 30.0–100.0)

## 2016-11-19 LAB — HEMOGLOBIN A1C
Est. average glucose Bld gHb Est-mCnc: 100 mg/dL
Hgb A1c MFr Bld: 5.1 % (ref 4.8–5.6)

## 2016-11-19 LAB — INSULIN, RANDOM: INSULIN: 15 u[IU]/mL (ref 2.6–24.9)

## 2016-11-25 MED FILL — valACYclovir HCL 1 GM TABS: 1 | 30 days supply | Qty: 30 | Fill #1

## 2016-12-03 ENCOUNTER — Ambulatory Visit (INDEPENDENT_AMBULATORY_CARE_PROVIDER_SITE_OTHER): Payer: 59 | Admitting: Family Medicine

## 2016-12-03 VITALS — BP 101/69 | HR 93 | Temp 98.3°F | Ht 66.0 in | Wt 219.0 lb

## 2016-12-03 DIAGNOSIS — E669 Obesity, unspecified: Secondary | ICD-10-CM | POA: Diagnosis not present

## 2016-12-03 DIAGNOSIS — Z6835 Body mass index (BMI) 35.0-35.9, adult: Secondary | ICD-10-CM | POA: Diagnosis not present

## 2016-12-03 DIAGNOSIS — E8881 Metabolic syndrome: Secondary | ICD-10-CM

## 2016-12-03 DIAGNOSIS — E559 Vitamin D deficiency, unspecified: Secondary | ICD-10-CM | POA: Diagnosis not present

## 2016-12-03 MED ORDER — VITAMIN D (ERGOCALCIFEROL) 1.25 MG (50000 UNIT) PO CAPS: 50000.0000 [IU] | ORAL_CAPSULE | ORAL | 0 refills | Status: AC

## 2016-12-03 MED FILL — VIT D2 1.25 MG (50,000 UNIT: 1.25 MG | 30 days supply | Qty: 10 | Fill #0

## 2016-12-03 NOTE — Progress Notes (Signed)
Office: [phone]  /  Fax: [phone]   HPI:   Chief Complaint: OBESITY Amy Smith is here to discuss her progress with her obesity treatment plan. She is following her eating plan approximately 95 % of the time and states she is exercising 0 minutes 0 times per week. 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 She is ready to get back on track. Her weight is 219 lb (99.3 kg) today and has had a weight gain of 1 lb over a period of 2 weeks since her last visit. She has lost 6 lbs since starting treatment with [name].  Vitamin D deficiency Amy Smith has a diagnosis of vitamin D deficiency. She is currently taking prescription vitamin D and is very slowly improving. She denies nausea, vomiting or muscle weakness.  Insulin Resistance Amy Smith has a diagnosis of insulin resistance based on her elevated fasting insulin level >5. Although Senie's blood glucose readings are still under good control, insulin resistance puts her at greater risk of metabolic syndrome and diabetes. She is taking metformin currently and fasting insulin is slowly improving with diet and metformin. She continues to work on diet and exercise to decrease risk of diabetes.  Wt Readings from Last 500 Encounters:  12/03/16 219 lb (99.3 kg)  11/18/16 218 lb (98.9 kg)  10/29/16 220 lb (99.8 kg)  10/08/16 222 lb (100.7 kg)  09/21/16 220 lb (99.8 kg)  09/08/16 222 lb (100.7 kg)  08/27/16 220 lb (99.8 kg)  08/10/16 225 lb (102.1 kg)  05/17/16 222 lb (100.7 kg)  03/26/16 221 lb (100.2 kg)  02/21/16 218 lb (98.9 kg)     ALLERGIES: Allergies  Allergen Reactions  . Cephalexin Hives  . Tramadol Other (See Comments)    Light headed & dizzy    MEDICATIONS: Current Outpatient Prescriptions on File Prior to Visit  Medication Sig Dispense Refill  . ALPRAZolam (XANAX) 0.5 MG tablet as needed.   0  . buPROPion (WELLBUTRIN SR) 200 MG 12 hr tablet Take 1  tablet (200 mg total) by mouth daily. 30 tablet 0  . clobetasol cream (TEMOVATE) 0.05 %   1  . cyclobenzaprine (FLEXERIL) 10 MG tablet Take 10 mg by mouth 3 (three) times daily as needed for muscle spasms.    . metFORMIN (GLUCOPHAGE) 500 MG tablet 1/2 Tab twice daily 30 tablet 0  . nystatin-triamcinolone (MYCOLOG II) cream Apply 1 application topically 2 (two) times daily. Apply to affected area BID for up to 7 days. 60 g 0  . polyethylene glycol powder (GLYCOLAX/MIRALAX) powder Take 17 g by mouth daily. 3350 g 0  . valACYclovir (VALTREX) 1000 MG tablet Take 1 tablet (1,000 mg total) by mouth as needed. 30 tablet 13   No current facility-administered medications on file prior to visit.     PAST MEDICAL HISTORY: Past Medical History:  Diagnosis Date  . Adenomyosis   . Anemia   . Anxiety   . Back pain   . DDD (degenerative disc disease), lumbar   . Dysmenorrhea   . Migraines   . Palpitations   . Ruptured lumbar disc   . Scoliosis   . STD (sexually transmitted disease)    HSV1  . Swelling   . Vitamin D deficiency     PAST SURGICAL HISTORY: Past Surgical History:  Procedure Laterality Date  . LAMINECTOMY AND MICRODISCECTOMY SPINE  2016   L4/L5, Dr. Saintclair Halsted  . TOTAL VAGINAL HYSTERECTOMY  2013  tubes and ovaries reviewed  . TUBAL LIGATION  2006    SOCIAL HISTORY: Social History  Substance Use Topics  . Smoking status: Never Smoker  . Smokeless tobacco: Never Used  . Alcohol use 0.0 - 0.6 oz/week     Comment: 1-2 drinks per wk, "give or take, if that" (03/2016)    FAMILY HISTORY: Family History  Problem Relation Age of Onset  . Hypertension Mother   . Stroke Mother   . Other Mother 66    hx of hysterectomy for adenomyosis  . High Cholesterol Mother   . Heart disease Mother   . Obesity Mother   . Diabetes Maternal Uncle   . Parkinson's disease Maternal Uncle 71  . Cataracts Maternal Grandmother   . Parkinson's disease Maternal Grandfather     late age of onset  .  COPD Maternal Grandfather     former smoker  . Breast cancer Paternal Grandmother     dx early 80s - right breast; now left breast beign biopsied  . Ovarian cancer Paternal Grandmother     dx late 50s-early 60s  . Diabetes Paternal Grandmother   . Prostate cancer Paternal Grandfather     dx 97s  . Alzheimer's disease Paternal Grandfather   . COPD Paternal Grandfather   . Lupus Cousin     maternal 1st cousin  . Stomach cancer Other     maternal great aunt (MGM's mother) dx older age  . Breast cancer Paternal Aunt 32    dx breast cancer at 46, then with contralateral at 56-46; w/ mets to bone at 35y  . Lung cancer Other     paternal great uncle (PGF's brother)    ROS: Review of Systems  Constitutional: Negative for weight loss.  Gastrointestinal: Negative for nausea and vomiting.  Musculoskeletal:       Negative muscle weakness    PHYSICAL EXAM: Blood pressure 101/69, pulse 93, temperature 98.3 F (36.8 C), temperature source Oral, height 5\' 6"  (1.676 m), weight 219 lb (99.3 kg), last menstrual period 03/17/2012, SpO2 98 %. Body mass index is 35.35 kg/m. Physical Exam  Constitutional: She is oriented to person, place, and time. She appears well-developed and well-nourished.  Cardiovascular: Normal rate.   Pulmonary/Chest: Effort normal.  Musculoskeletal: Normal range of motion.  Neurological: She is oriented to person, place, and time.  Skin: Skin is warm and dry.  Psychiatric: She has a normal mood and affect. Her behavior is normal.  Vitals reviewed.   RECENT LABS AND TESTS: BMET    Component Value Date/Time   NA 138 11/18/2016 1222   K 4.6 11/18/2016 1222   CL 100 11/18/2016 1222   CO2 24 11/18/2016 1222   GLUCOSE 79 11/18/2016 1222   GLUCOSE 71 05/27/2016 1415   BUN 10 11/18/2016 1222   CREATININE 0.91 11/18/2016 1222   CREATININE 1.04 05/27/2016 1415   CALCIUM 9.2 11/18/2016 1222   GFRNONAA 80 11/18/2016 1222   GFRAA 93 11/18/2016 1222   Lab Results    Component Value Date   HGBA1C 5.1 11/18/2016   HGBA1C 4.9 08/11/2016   HGBA1C 5.0 02/24/2016   Lab Results  Component Value Date   INSULIN 15.0 11/18/2016   INSULIN 24.5 08/11/2016   CBC    Component Value Date/Time   WBC 5.5 08/11/2016 0950   WBC 7.4 05/27/2016 1415   RBC 4.67 08/11/2016 0950   RBC 5.13 (H) 05/27/2016 1415   HGB 14.2 05/27/2016 1415   HCT 39.6 08/11/2016 0950  PLT 241 05/27/2016 1415   MCV 85 08/11/2016 0950   MCH 28.7 08/11/2016 0950   MCH 27.7 05/27/2016 1415   MCHC 33.8 08/11/2016 0950   MCHC 32.0 05/27/2016 1415   RDW 14.0 08/11/2016 0950   LYMPHSABS 2.3 08/11/2016 0950   EOSABS 0.1 08/11/2016 0950   BASOSABS 0.0 08/11/2016 0950   Iron/TIBC/Ferritin/ %Sat    Component Value Date/Time   IRON 33 08/11/2016 0950   TIBC 298 08/11/2016 0950   FERRITIN 31 08/11/2016 0950   IRONPCTSAT 11 (L) 08/11/2016 0950   Lipid Panel     Component Value Date/Time   CHOL 167 08/11/2016 0950   TRIG 78 08/11/2016 0950   HDL 56 08/11/2016 0950   CHOLHDL 2.7 02/24/2016 0846   VLDL 23 02/24/2016 0846   LDLCALC 95 08/11/2016 0950   Hepatic Function Panel     Component Value Date/Time   PROT 6.9 11/18/2016 1222   ALBUMIN 4.1 11/18/2016 1222   AST 13 11/18/2016 1222   ALT 12 11/18/2016 1222   ALKPHOS 65 11/18/2016 1222   BILITOT 0.3 11/18/2016 1222      Component Value Date/Time   TSH 1.320 08/11/2016 0950   TSH 1.13 02/24/2016 0846    ASSESSMENT AND PLAN: Vitamin D deficiency - Plan: Vitamin D, Ergocalciferol, (DRISDOL) 50000 units CAPS capsule  Insulin resistance  Class 2 obesity without serious comorbidity with body mass index (BMI) of 35.0 to 35.9 in adult, unspecified obesity type  PLAN:  Vitamin D Deficiency Kayah was informed that low vitamin D levels contributes to fatigue and are associated with obesity, breast, and colon cancer. She agrees to increase prescription Vit D @50 ,000 IU from every week to every 3 days #10 with no refills and  will follow up for routine testing of vitamin D, at least 2-3 times per year. She was informed of the risk of over-replacement of vitamin D and agrees to not increase her dose unless he discusses this with [name] first. Mykaila agrees to follow up with our clinic in 3 weeks.  Insulin Resistance Ivannah will continue to work on weight loss, exercise, and decreasing simple carbohydrates in her diet to help decrease the risk of diabetes. We dicussed metformin including benefits and risks. She was informed that eating too many simple carbohydrates or too many calories at one sitting increases the likelihood of GI side effects. Adley agrees to continue to take metformin for now and will follow up with [name] as directed to monitor her progress.  Obesity Calvin is currently in the action stage of change. As such, her goal is to continue with weight loss efforts She has agreed to change to follow the Category 2 plan Bernestine has been instructed to work up to a goal of 150 minutes of combined cardio and strengthening exercise per week for weight loss and overall health benefits. We discussed the following Behavioral Modification Stratagies today: increasing lean protein intake, increasing lower sugar fruits, work on meal planning and easy cooking plans and dealing with family or coworker sabotage  Dinara has agreed to follow up with our clinic in 3 weeks. She was informed of the importance of frequent follow up visits to maximize her success with intensive lifestyle modifications for her multiple health conditions.  I, Doreene Nest, am acting as scribe for Dennard Nip, MD  I have reviewed the above documentation for accuracy and completeness, and I agree with the above. -Dennard Nip, MD

## 2016-12-21 ENCOUNTER — Encounter (INDEPENDENT_AMBULATORY_CARE_PROVIDER_SITE_OTHER): Payer: Self-pay | Admitting: Family Medicine

## 2016-12-21 NOTE — Telephone Encounter (Signed)
Appt cancellation.

## 2016-12-22 ENCOUNTER — Ambulatory Visit (INDEPENDENT_AMBULATORY_CARE_PROVIDER_SITE_OTHER): Payer: 59 | Admitting: Family Medicine

## 2017-02-09 MED FILL — BRIMONIDINE TARTRATE 0.15%: 0.15 | 50 days supply | Qty: 5 | Fill #0

## 2017-03-03 ENCOUNTER — Encounter (HOSPITAL_COMMUNITY): Payer: Self-pay

## 2017-03-15 ENCOUNTER — Encounter: Payer: Self-pay | Admitting: Certified Nurse Midwife

## 2017-03-16 NOTE — Telephone Encounter (Signed)
Medication refill request: valtrex  Last AEX:  02/21/16 SM  Next AEX: none Last MMG (if hormonal medication request): 03/11/16 [id number]:neg  Refill authorized: 02/21/16 #[id number]. Today please advise.

## 2017-03-17 ENCOUNTER — Encounter: Payer: Self-pay | Admitting: Certified Nurse Midwife

## 2017-03-17 MED ORDER — VALACYCLOVIR HCL 1 G PO TABS: 1000.0000 mg | ORAL_TABLET | ORAL | 5 refills | Status: AC | PRN

## 2019-11-10 ENCOUNTER — Encounter: Payer: Self-pay | Admitting: Certified Nurse Midwife

## 2019-11-28 ENCOUNTER — Other Ambulatory Visit: Payer: Self-pay | Admitting: *Deleted

## 2019-11-28 DIAGNOSIS — Z1231 Encounter for screening mammogram for malignant neoplasm of breast: Secondary | ICD-10-CM

## 2019-12-08 ENCOUNTER — Other Ambulatory Visit: Payer: Self-pay

## 2019-12-08 ENCOUNTER — Ambulatory Visit
Admission: RE | Admit: 2019-12-08 | Discharge: 2019-12-08 | Disposition: A | Discharge status: Home / Self Care | Payer: BC Managed Care – PPO | Source: Ambulatory Visit | Attending: *Deleted | Admitting: *Deleted

## 2019-12-08 DIAGNOSIS — Z1231 Encounter for screening mammogram for malignant neoplasm of breast: Secondary | ICD-10-CM

## 2020-12-09 ENCOUNTER — Other Ambulatory Visit: Payer: Self-pay | Admitting: Obstetrics & Gynecology

## 2020-12-09 DIAGNOSIS — R2231 Localized swelling, mass and lump, right upper limb: Secondary | ICD-10-CM

## 2021-01-17 ENCOUNTER — Other Ambulatory Visit: Payer: Self-pay | Admitting: Obstetrics & Gynecology

## 2021-01-17 ENCOUNTER — Ambulatory Visit
Admission: RE | Admit: 2021-01-17 | Discharge: 2021-01-17 | Disposition: A | Discharge status: Home / Self Care | Payer: BC Managed Care – PPO | Source: Ambulatory Visit | Attending: Obstetrics & Gynecology | Admitting: Obstetrics & Gynecology

## 2021-01-17 ENCOUNTER — Other Ambulatory Visit: Payer: Self-pay

## 2021-01-17 DIAGNOSIS — R2231 Localized swelling, mass and lump, right upper limb: Secondary | ICD-10-CM

## 2022-01-16 ENCOUNTER — Other Ambulatory Visit: Payer: Self-pay | Admitting: Obstetrics & Gynecology

## 2022-01-16 DIAGNOSIS — Z1231 Encounter for screening mammogram for malignant neoplasm of breast: Secondary | ICD-10-CM

## 2022-01-23 ENCOUNTER — Ambulatory Visit
Admission: RE | Admit: 2022-01-23 | Discharge: 2022-01-23 | Disposition: A | Discharge status: Home / Self Care | Payer: BC Managed Care – PPO | Source: Ambulatory Visit | Attending: Obstetrics & Gynecology | Admitting: Obstetrics & Gynecology

## 2022-01-23 DIAGNOSIS — Z1231 Encounter for screening mammogram for malignant neoplasm of breast: Secondary | ICD-10-CM

## 2024-04-03 ENCOUNTER — Other Ambulatory Visit: Payer: Self-pay | Admitting: Medical Genetics

## 2024-05-19 ENCOUNTER — Other Ambulatory Visit (HOSPITAL_COMMUNITY)

## 2024-08-11 ENCOUNTER — Other Ambulatory Visit (HOSPITAL_COMMUNITY)
Admission: RE | Admit: 2024-08-11 | Discharge: 2024-08-11 | Disposition: A | Discharge status: Home / Self Care | Payer: Self-pay | Source: Ambulatory Visit | Attending: Medical Genetics | Admitting: Medical Genetics

## 2024-08-29 LAB — GENECONNECT MOLECULAR SCREEN: Genetic Analysis Overall Interpretation: NEGATIVE

## 2024-08-30 ENCOUNTER — Encounter (INDEPENDENT_AMBULATORY_CARE_PROVIDER_SITE_OTHER): Payer: Self-pay
